# Patient Record
Sex: Male | Born: 1990 | Race: Black or African American | Hispanic: No | Marital: Single | Smoking: Former smoker
Health system: Southern US, Community
[De-identification: ages and names within clinical notes are randomized; demographics above are authoritative.]

## PROBLEM LIST (undated history)

## (undated) ENCOUNTER — Emergency Department: Admission: EM

## (undated) DIAGNOSIS — N4400 Torsion of testis, unspecified: Secondary | ICD-10-CM

## (undated) HISTORY — PX: TESTICLE REMOVAL: SHX68

---

## 2015-09-16 ENCOUNTER — Other Ambulatory Visit
Admission: RE | Admit: 2015-09-16 | Discharge: 2015-09-16 | Disposition: A | Discharge status: Home / Self Care | Attending: Family Medicine | Admitting: Family Medicine

## 2015-09-16 NOTE — Progress Notes (Signed)
Pt brought in custody with [employer] officer Lanae BoastGarner,  RN Broomer collected specimen for forensic blood draw, pt willingly accepted procedure, [employer] officers remain present, Skin prepped with iodine. Pt ambulatory and calm

## 2016-12-11 ENCOUNTER — Encounter: Payer: Self-pay | Admitting: Emergency Medicine

## 2016-12-11 ENCOUNTER — Emergency Department
Admission: EM | Admit: 2016-12-11 | Discharge: 2016-12-11 | Disposition: A | Discharge status: Home / Self Care | Attending: Emergency Medicine | Admitting: Emergency Medicine

## 2016-12-11 DIAGNOSIS — F17200 Nicotine dependence, unspecified, uncomplicated: Secondary | ICD-10-CM | POA: Insufficient documentation

## 2016-12-11 DIAGNOSIS — H00015 Hordeolum externum left lower eyelid: Secondary | ICD-10-CM | POA: Insufficient documentation

## 2016-12-11 MED ORDER — GENTAMICIN SULFATE 0.3 % OP SOLN: 1.0000 [drp] | OPHTHALMIC | 0 refills | Status: DC

## 2016-12-11 NOTE — ED Notes (Signed)
Patient reports symptoms began 2 days ago.  Report woke with pain to left eye and around the eye, with increased tearing.  Denies any blurred vision.  Patient reports feels like something is in his eye.

## 2016-12-11 NOTE — ED Notes (Signed)

## 2016-12-11 NOTE — ED Triage Notes (Signed)
Pt c/o left eye pain starting today.  Only has tearing. NAD. Ambulatory. Denies vision changes.  Denies sx other than pain

## 2016-12-11 NOTE — ED Provider Notes (Signed)
[facility] Emergency Department Provider Note   ____________________________________________   First MD Initiated Contact with Patient 12/11/16 2013     (approximate)  I have reviewed the triage vital signs and the nursing notes.   HISTORY  Chief Complaint Eye Pain    HPI Sean Shaw is a 26 y.o. male patient complaining of left eye pain which started yesterday. Patient state increased tearing. Patient denies vision loss. Patient denies use of corrective lenses.   History reviewed. No pertinent past medical history.  There are no active problems to display for this patient.   History reviewed. No pertinent surgical history.  Prior to Admission medications   Medication Sig Start Date End Date Taking? Authorizing Provider  gentamicin (GARAMYCIN) 0.3 % ophthalmic solution Place 1 drop into the left eye every 4 (four) hours. 12/11/16   Joni ReiningSmith, Anselm Aumiller K, PA-C    Allergies Patient has no known allergies.  History reviewed. No pertinent family history.  Social History Social History  Substance Use Topics  . Smoking status: Current Every Day Smoker  . Smokeless tobacco: Never Used  . Alcohol use No    Review of Systems Constitutional: No fever/chills Eyes: Left eye pain and tearing  ENT: No sore throat. Cardiovascular: Denies chest pain. Respiratory: Denies shortness of breath. Gastrointestinal: No abdominal pain.  No nausea, no vomiting.  No diarrhea.  No constipation. Genitourinary: Negative for dysuria. Musculoskeletal: Negative for back pain. Skin: Negative for rash. Neurological: Negative for headaches, focal weakness or numbness.   ____________________________________________   PHYSICAL EXAM:  VITAL SIGNS: ED Triage Vitals  Enc Vitals Group     BP 12/11/16 1858 (!) 107/57     Pulse Rate 12/11/16 1857 (!) 57     Resp 12/11/16 1857 18     Temp 12/11/16 1857 98.6 F (37 C)     Temp Source 12/11/16 1857 Oral     SpO2  12/11/16 1857 100 %     Weight 12/11/16 1857 120 lb (54.4 kg)     Height 12/11/16 1857 5\' 4"  (1.626 m)     Head Circumference --      Peak Flow --      Pain Score 12/11/16 1857 7     Pain Loc --      Pain Edu? --      Excl. in GC? --     Constitutional: Alert and oriented. Well appearing and in no acute distress. Eyes: Conjunctivae are normal. PERRL. EOMI. Internal papular lesion left lower eyelid Cardiovascular: Normal rate, regular rhythm. Grossly normal heart sounds.  Good peripheral circulation. Respiratory: Normal respiratory effort.  No retractions. Lungs CTAB. Neurologic:  Normal speech and language. No gross focal neurologic deficits are appreciated. No gait instability. Skin:  Skin is warm, dry and intact. No rash noted. Psychiatric: Mood and affect are normal. Speech and behavior are normal.  ____________________________________________   LABS (all labs ordered are listed, but only abnormal results are displayed)  Labs Reviewed - No data to display ____________________________________________  EKG   ____________________________________________  RADIOLOGY  No results found.  ____________________________________________   PROCEDURES  Procedure(s) performed: None  Procedures  Critical Care performed: No  ____________________________________________   INITIAL IMPRESSION / ASSESSMENT AND PLAN / ED COURSE  Pertinent labs & imaging results that were available during my care of the patient were reviewed by me and considered in my medical decision making (see chart for details).  Stye left lower eyelid. He is given discharge care instruction. Patient advised follow-up  with [facility] if no improvement in 3-5 days.      ____________________________________________   FINAL CLINICAL IMPRESSION(S) / ED DIAGNOSES  Final diagnoses:  Hordeolum externum of left lower eyelid      NEW MEDICATIONS STARTED DURING THIS VISIT:  New Prescriptions    GENTAMICIN (GARAMYCIN) 0.3 % OPHTHALMIC SOLUTION    Place 1 drop into the left eye every 4 (four) hours.     Note:  This document was prepared using Dragon voice recognition software and may include unintentional dictation errors.    Joni ReiningSmith, Kynsleigh Westendorf K, PA-C 12/11/16 2023    Emily FilbertWilliams, Jonathan E, MD 12/11/16 [phone]

## 2018-12-05 ENCOUNTER — Other Ambulatory Visit: Payer: Self-pay | Admitting: Internal Medicine

## 2018-12-05 DIAGNOSIS — Z20822 Contact with and (suspected) exposure to covid-19: Secondary | ICD-10-CM

## 2018-12-09 LAB — NOVEL CORONAVIRUS, NAA: SARS-CoV-2, NAA: NOT DETECTED

## 2019-10-15 DIAGNOSIS — N4400 Torsion of testis, unspecified: Secondary | ICD-10-CM

## 2019-10-15 HISTORY — DX: Torsion of testis, unspecified: N44.00

## 2019-12-31 ENCOUNTER — Encounter: Payer: Self-pay | Admitting: Emergency Medicine

## 2019-12-31 ENCOUNTER — Other Ambulatory Visit: Payer: Self-pay

## 2019-12-31 ENCOUNTER — Emergency Department: Payer: Medicaid Other

## 2019-12-31 DIAGNOSIS — F17210 Nicotine dependence, cigarettes, uncomplicated: Secondary | ICD-10-CM | POA: Insufficient documentation

## 2019-12-31 DIAGNOSIS — R52 Pain, unspecified: Secondary | ICD-10-CM | POA: Insufficient documentation

## 2019-12-31 LAB — URINALYSIS, COMPLETE (UACMP) WITH MICROSCOPIC
Bacteria, UA: NONE SEEN
Bilirubin Urine: NEGATIVE
Glucose, UA: NEGATIVE mg/dL
Ketones, ur: NEGATIVE mg/dL
Leukocytes,Ua: NEGATIVE
Nitrite: NEGATIVE
Protein, ur: NEGATIVE mg/dL
Specific Gravity, Urine: 1.021 (ref 1.005–1.030)
pH: 6 (ref 5.0–8.0)

## 2019-12-31 NOTE — ED Triage Notes (Signed)
Pt presents to ED with right sided testicular pain for the past hour. Pt states the last time he felt this way was June 2 when he had a torsion on the left side and had emergency surgery at [facility]. Denies swelling, bruising, or injury at this time.

## 2020-01-01 ENCOUNTER — Emergency Department
Admission: EM | Admit: 2020-01-01 | Discharge: 2020-01-01 | Disposition: A | Discharge status: Home / Self Care | Payer: Medicaid Other | Attending: Emergency Medicine | Admitting: Emergency Medicine

## 2020-01-01 ENCOUNTER — Other Ambulatory Visit: Payer: Self-pay

## 2020-01-01 DIAGNOSIS — N50811 Right testicular pain: Secondary | ICD-10-CM

## 2020-01-01 DIAGNOSIS — R52 Pain, unspecified: Secondary | ICD-10-CM

## 2020-01-01 HISTORY — DX: Torsion of testis, unspecified: N44.00

## 2020-01-01 NOTE — ED Provider Notes (Signed)
[facility] Emergency Department Provider Note ____________________________________________   First MD Initiated Contact with Patient 01/01/20 0802     (approximate)  I have reviewed the triage vital signs and the nursing notes.  HISTORY  Chief Complaint Testicle Pain   HPI Sean Shaw is a 29 y.o. malewho presents to the ED for evaluation of testicular pain.  Chart review indicates hx testicular torsion 2 months ago at [facility] where he had emergent surgical right-sided orchiopexy and left-sided orchiectomy.  Patient reports being in his typical state of health until experiencing sudden onset pain in the evening of 8/17 at about 930 PM.  Due to his recent procedure, patient reports concern for repeat detorsion.  Patient reports pain was severe, 9/10 intensity, to his right testicle.  Radiating to his right groin.  Reports the pain is reminiscent of his previous episode of testicular torsion.  When I see the patient, he has been sitting in the ED waiting room for about 10 hours.  Patient reports improving pain, some "lingering soreness. "Patient denies any vomiting, frontal abdominal pain, back pain, dysuria, hematuria, diarrhea, fevers or syncope.  He reports he does not have insurance, has not followed up with urology since his procedure.    Past Medical History:  Diagnosis Date  . Testicular torsion     There are no problems to display for this patient.   Past Surgical History:  Procedure Laterality Date  . TESTICLE REMOVAL      Prior to Admission medications   Not on File    Allergies Patient has no known allergies.  No family history on file.  Social History Social History   Tobacco Use  . Smoking status: Current Every Day Smoker    Types: Cigarettes  . Smokeless tobacco: Never Used  Vaping Use  . Vaping Use: Never used  Substance Use Topics  . Alcohol use: Yes  . Drug use: Yes    Types: Marijuana    Review of  Systems  Constitutional: No fever/chills Eyes: No visual changes. ENT: No sore throat. Cardiovascular: Denies chest pain. Respiratory: Denies shortness of breath. Gastrointestinal: No abdominal pain.  No nausea, no vomiting.  No diarrhea.  No constipation. Genitourinary: Negative for dysuria.  Positive for testicular pain. Musculoskeletal: Negative for back pain. Skin: Negative for rash. Neurological: Negative for headaches, focal weakness or numbness.   ____________________________________________   PHYSICAL EXAM:  VITAL SIGNS: Vitals:   01/01/20 0803 01/01/20 0815  BP:  114/78  Pulse: 62 62  Resp:  16  Temp:    SpO2:  100%      Constitutional: Alert and oriented. Well appearing and in no acute distress.  Sitting up in bed, well-appearing and conversational full sentences. Eyes: Conjunctivae are normal. PERRL. EOMI. Head: Atraumatic. Nose: No congestion/rhinnorhea. Mouth/Throat: Mucous membranes are moist.  Oropharynx non-erythematous. Neck: No stridor. No cervical spine tenderness to palpation. Cardiovascular: Normal rate, regular rhythm. Grossly normal heart sounds.  Good peripheral circulation. Respiratory: Normal respiratory effort.  No retractions. Lungs CTAB. Gastrointestinal: Soft , nondistended, nontender to palpation. No abdominal bruits. No CVA tenderness. Normal-appearing uncircumcised penis without lesions or tenderness.  Scrotum without swelling, erythema or tenderness.  Left testicle is absent.  Right testicle is smooth, round and without nodularity.  No tenderness to palpation. Musculoskeletal: No lower extremity tenderness nor edema.  No joint effusions. No signs of acute trauma. Neurologic:  Normal speech and language. No gross focal neurologic deficits are appreciated. No gait instability noted. Skin:  Skin  is warm, dry and intact. No rash noted. Psychiatric: Mood and affect are normal. Speech and behavior are  normal.  ____________________________________________   LABS (all labs ordered are listed, but only abnormal results are displayed)  Labs Reviewed  URINALYSIS, COMPLETE (UACMP) WITH MICROSCOPIC - Abnormal; Notable for the following components:      Result Value   Color, Urine YELLOW (*)    APPearance CLEAR (*)    Hgb urine dipstick SMALL (*)    All other components within normal limits   ____________________________________________  12 Lead EKG   ____________________________________________  RADIOLOGY  ED MD interpretation:    Official radiology report(s): US SCROTUM W/DOPPLER  Result Date: 01/01/2020 CLINICAL DATA:  Right testicular pain for 1 hour, history of left testicular torsion 10/2019 EXAM: SCROTAL ULTRASOUND DOPPLER ULTRASOUND OF THE TESTICLES TECHNIQUE: Complete ultrasound examination of the testicles, epididymis, and other scrotal structures was performed. Color and spectral Doppler ultrasound were also utilized to evaluate blood flow to the testicles. COMPARISON:  None. FINDINGS: Right testicle the Measurements: 3.9 x 1.8 x 2.6 cm. Uniform parenchymal echogenicity. No concerning testicular mass or microlithiasis. Linear calcifications noted along the left tunica vaginalis, could reflect sequela of prior infection or inflammation or even postsurgical change. Uniform color flow. Pulsed Doppler interrogation of the right testis demonstrates normal low resistance arterial and venous waveforms. Left testicle Surgically absent Right epididymis:  Normal in size and appearance. Left epididymis:  Surgically absent. Hydrocele:  None visualized. Varicocele:  None visualized. IMPRESSION: No convincing sonographic evidence of right testicular torsion. No concerning right testicular mass or microlithiasis. Few calcifications presumably along the tunica may be post infectious or inflammatory. Left testicle is surgically absent. Electronically Signed   By: Kreg Shropshire M.D.   On: 01/01/2020  00:25    ____________________________________________   PROCEDURES and INTERVENTIONS  Procedure(s) performed (including Critical Care):  Procedures  Medications - No data to display  ____________________________________________   INITIAL IMPRESSION / ASSESSMENT AND PLAN / ED COURSE  29 year old male with history of left testicular torsion and orchiectomy, presenting with improving right testicular pain, without sonographic evidence of torsion and amenable to outpatient management.  Normal vital signs room air.  Reassuring examination without evidence of acute pathology.  He has no abdominal tenderness, inguinal tenderness or masses, and his testicular exam is benign.  Urine without infectious features to suggest UTI, prostatitis or epididymitis.  Ultrasound of his testicles demonstrates a normal-appearing right testicle, surgically absent left testicle.  Hyperechoic calcifications are likely due to postsurgical changes in the setting of his orchiopexy.  Patient is pain-free here in the ED and looks well, I see no evidence of acute pathology to require further emergent urologic consultation.  Provided urology follow-up information within our system as he has not followed up with [facility].  We discussed return precautions for the ED.  Patient medically stable for discharge home.  ____________________________________________   FINAL CLINICAL IMPRESSION(S) / ED DIAGNOSES  Final diagnoses:  Pain  Testicular pain, right     ED Discharge Orders    None       Mung Rinker   Note:  This document was prepared using Dragon voice recognition software and may include unintentional dictation errors.   Delton Prairie, MD 01/01/20 [phone]

## 2020-01-01 NOTE — ED Notes (Signed)
See triage note  Presents with pain to right testicle last pm  States pain was sudden  Hx of torsion and has had left testicle removed  No fever or urinary sxs' at present

## 2020-01-01 NOTE — Discharge Instructions (Signed)
The ultrasound of your testicles and urine test did not show signs of twisting or infection.  I have attached the phone number for one of our urologist, please call their office to discuss the possibility of being seen without insurance.  Return to the ED with any worsening pain.  Please take Tylenol and ibuprofen/Advil for your pain.  It is safe to take them together, or to alternate them every few hours.  Take up to 1000mg  of Tylenol at a time, up to 4 times per day.  Do not take more than 4000 mg of Tylenol in 24 hours.  For ibuprofen, take 400-600 mg, 4-5 times per day.

## 2021-10-16 IMAGING — US US SCROTUM W/ DOPPLER COMPLETE
1 series · 13 of 25 positions shown · non-contrast
Comparison: None.

CLINICAL DATA: Right testicular pain for 1 hour, history of left
testicular torsion [DATE]

EXAM:
SCROTAL ULTRASOUND
DOPPLER ULTRASOUND OF THE TESTICLES
TECHNIQUE: Complete ultrasound examination of the testicles, epididymis, and
other scrotal structures was performed. Color and spectral Doppler
ultrasound were also utilized to evaluate blood flow to the
testicles.

[Series 1: us scrotum w/doppler · 13 of 36 slices shown]
[im 1/36]
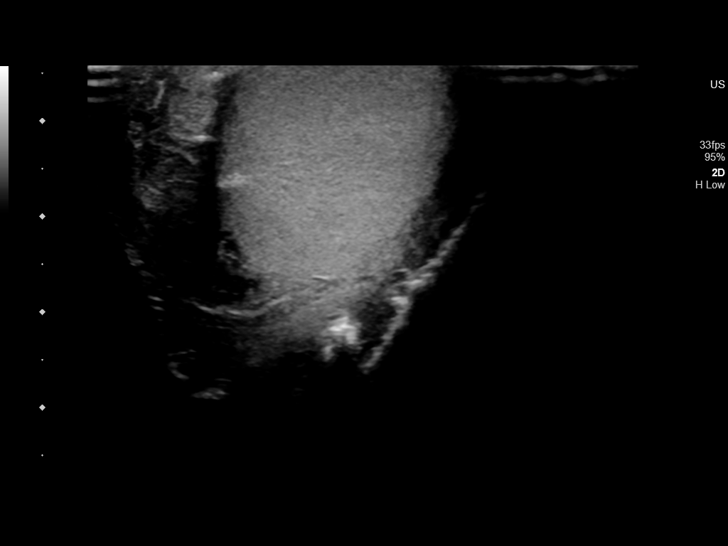
[im 3/36]
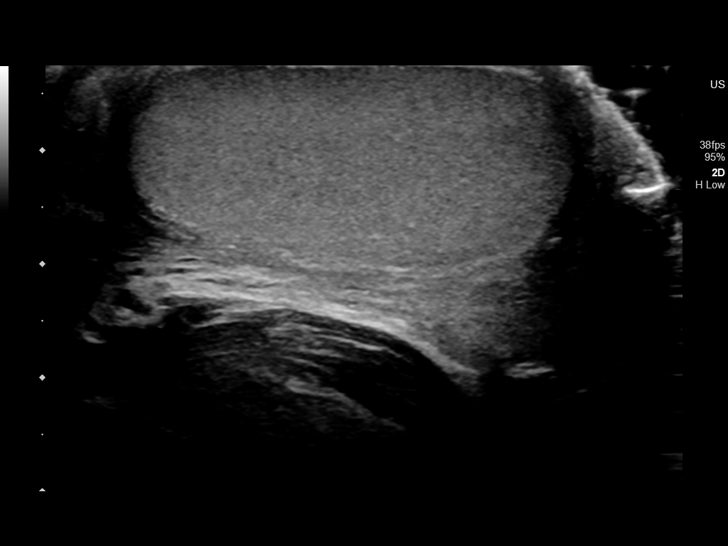
[im 6/36]
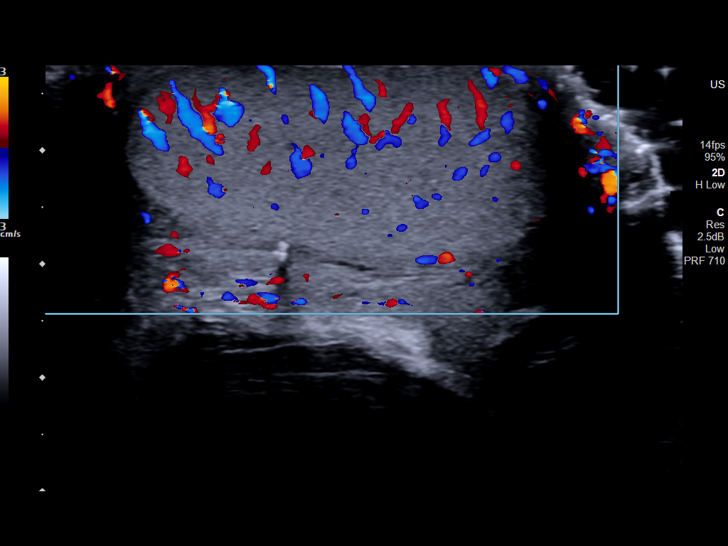
[im 9/36]
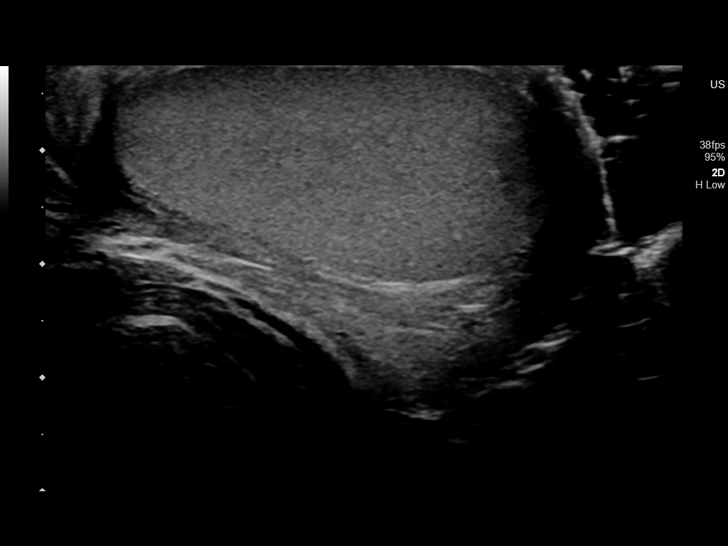
[im 12/36]
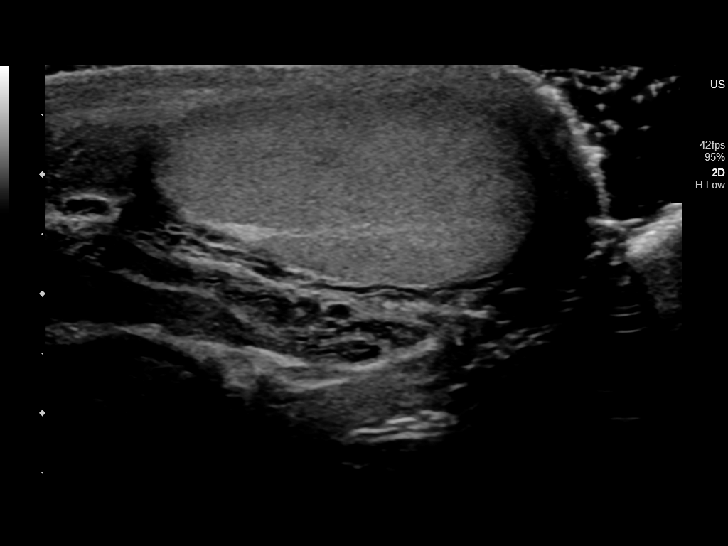
[im 15/36]
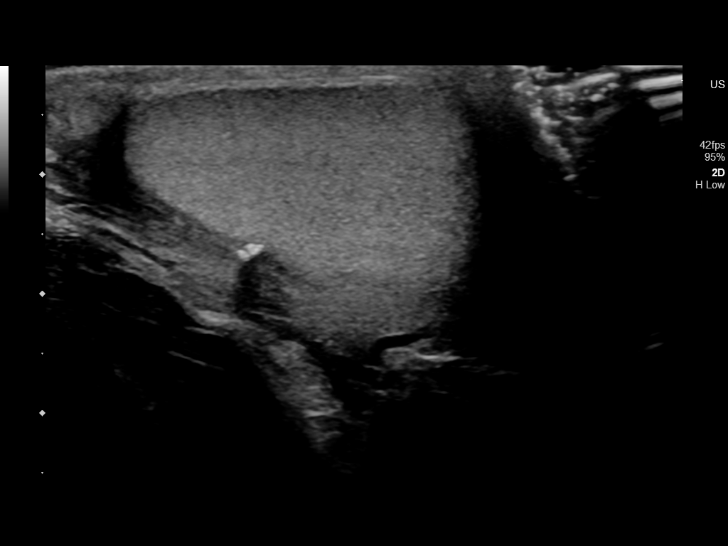
[im 18/36]
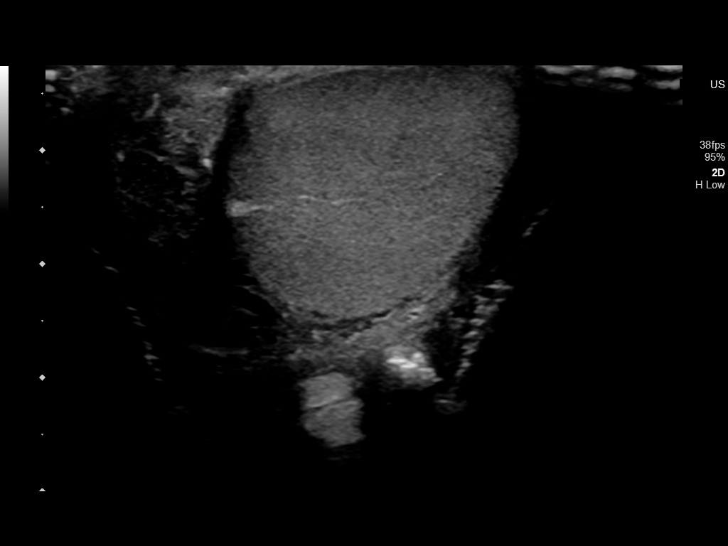
[im 21/36]
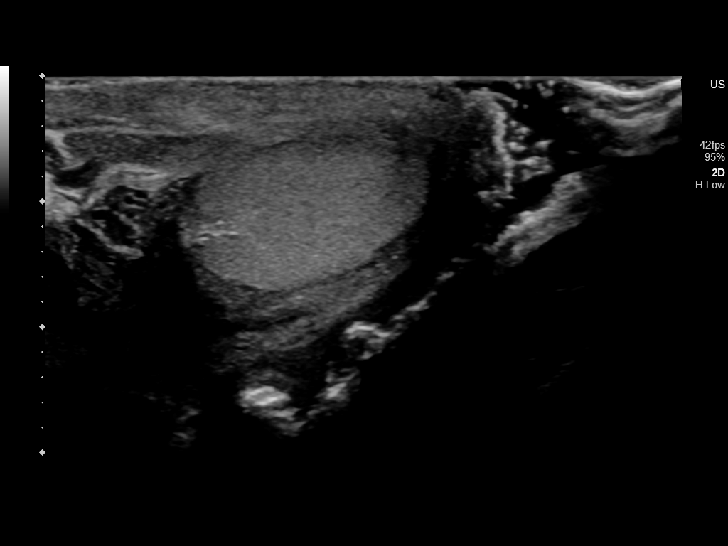
[im 24/36]
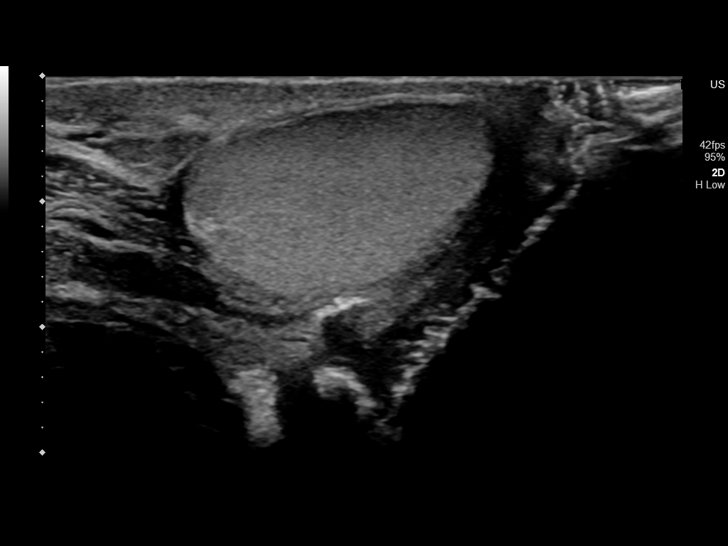
[im 27/36]
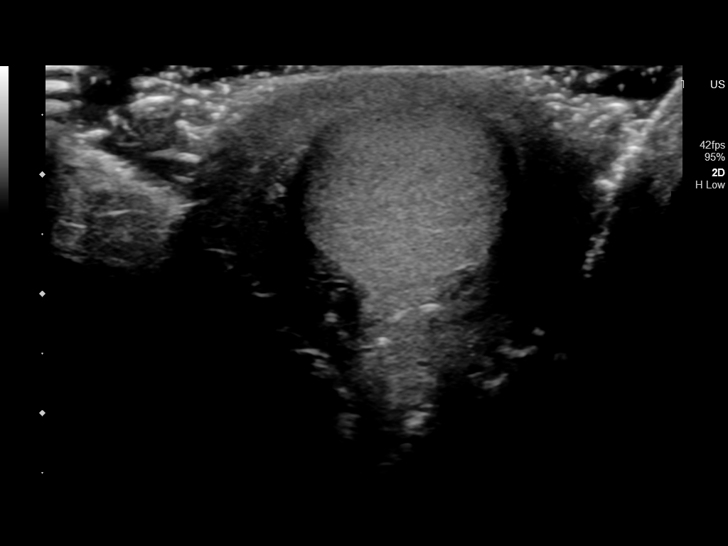
[im 30/36]
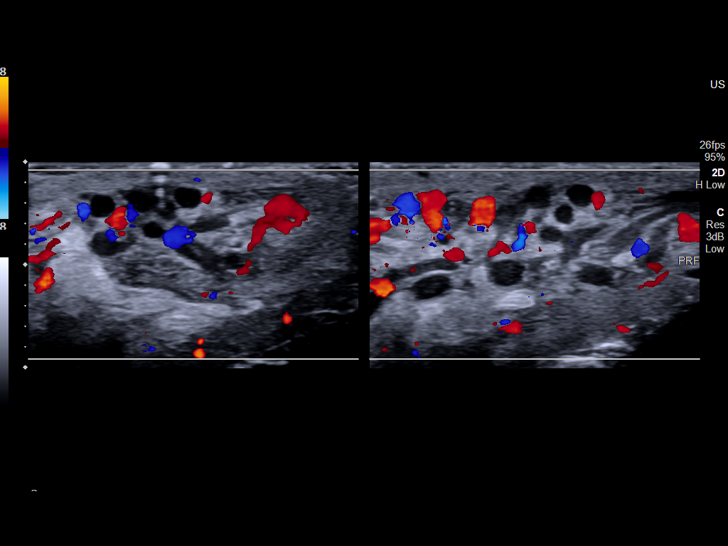
[im 33/36]
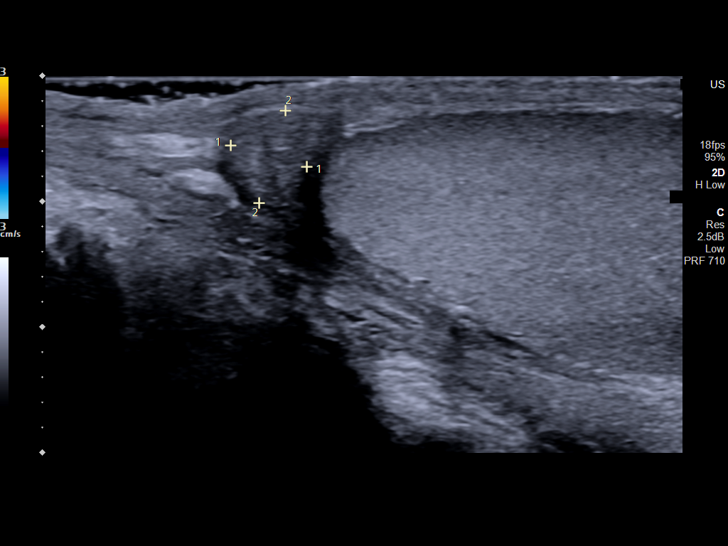
[im 36/36]
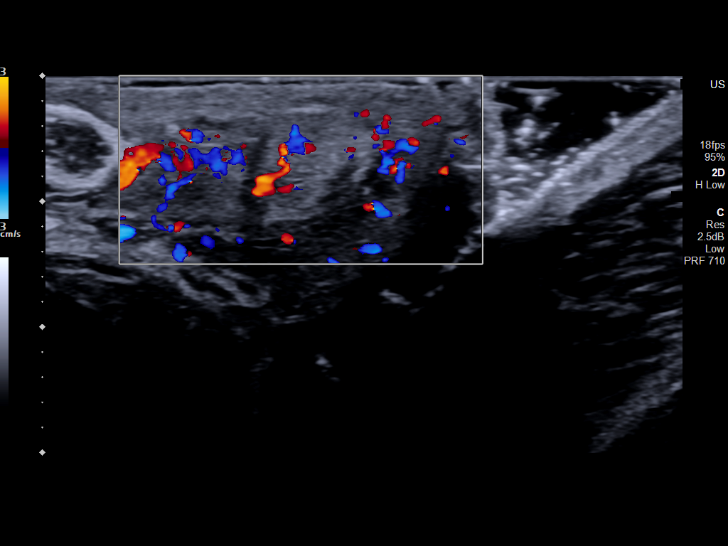

[13 of 25 positions shown; findings below may reference images not displayed]

FINDINGS: Right testicle the

Measurements: 3.9 x 1.8 x 2.6 cm. Uniform parenchymal echogenicity.
No concerning testicular mass or microlithiasis. Linear
calcifications noted along the left tunica vaginalis, could reflect
sequela of prior infection or inflammation or even postsurgical
change. Uniform color flow. Pulsed Doppler interrogation of the
right testis demonstrates normal low resistance arterial and venous
waveforms.

Left testicle

Surgically absent

Right epididymis:  Normal in size and appearance.

Left epididymis:  Surgically absent.

Hydrocele:  None visualized.

Varicocele:  None visualized.
IMPRESSION: No convincing sonographic evidence of right testicular torsion.

No concerning right testicular mass or microlithiasis. Few
calcifications presumably along the tunica may be post infectious or
inflammatory.

Left testicle is surgically absent.

## 2022-03-02 NOTE — Congregational Nurse Program (Signed)
  Dept: [phone]   Congregational Nurse Program Note  Date of Encounter: 03/02/2022 Client to [facility] with request for assistance with a dental appointment. Dental referral form completed and emailed to [facility]. The office is closed today, RN to contact the dental office tomorrow 10/19 to make an appointment. No other needs at this time. Past Medical History: Past Medical History:  Diagnosis Date   Testicular torsion     Encounter Details:  CNP Questionnaire - 03/02/22 1305       Questionnaire   Ask client: Do you give verbal consent for me to treat you today? Yes    Student Assistance N/A    Location Patient [place]    Visit Setting with Client Organization    Patient Status Unhoused   client lives in a tent   Insurance Uninsured ([place] Card/Care Connects/Self-Pay/Medicaid Family Planning)    Insurance/Financial Assistance Referral N/A    Medication N/A    Medical Provider No    Screening Referrals Made N/A    Medical Referrals Made Dental    Medical Appointment Made N/A    Recently w/o PCP, now 1st time PCP visit completed due to CNs referral or appointment made N/A    Food Have Food Insecurities   does not have food stamps   Transportation N/A   has a car   Housing/Utilities No permanent housing    Interpersonal Safety N/A    Interventions Advocate/Support;Case Management    Abnormal to Normal Screening Since Last CN Visit N/A    Screenings CN Performed N/A    Sent Client to Lab for: N/A    Did client attend any of the following based off CNs referral or appointments made? N/A    ED Visit Averted N/A    Life-Saving Intervention Made N/A

## 2022-03-03 NOTE — Congregational Nurse Program (Signed)
  Dept: [phone]   Congregational Nurse Program Note  Date of Encounter: 03/03/2022 RN called [name] to make an appointment following referral made yesterday. RN was unable to make as appointment as [facility] does not see clients for cleaning only. They are seeing [name] clients for extractions only. Client notified. Past Medical History: Past Medical History:  Diagnosis Date   Testicular torsion     Encounter Details:  CNP Questionnaire - 03/03/22 1000       Questionnaire   Ask client: Do you give verbal consent for me to treat you today? Yes    Student Assistance N/A    Location Patient [place]    Visit Setting with Client Organization    Patient Status Unhoused   client lives in a tent   Insurance Uninsured ([place] Card/Care Connects/Self-Pay/Medicaid Family Planning)    Insurance/Financial Assistance Referral N/A    Medication N/A    Medical Provider No    Screening Referrals Made N/A    Medical Referrals Made N/A   dental referral made to [facility] dentistry, they are unable to see clients just for a cleaning, RN was unable to make and appointment. Client notified   Medical Appointment Made N/A   unable to make an appointment as [facility] does only extractions, not regular cleanings, clientmade aware   Recently w/o PCP, now 1st time PCP visit completed due to CNs referral or appointment made [place] Insecurities   does not have food stamps   Transportation N/A   has a car   Housing/Utilities No permanent housing    Interpersonal Safety N/A    Interventions Case Management;Educate    Abnormal to Normal Screening Since Last CN Visit N/A    Screenings CN Performed N/A    Sent Client to Lab for: N/A    Did client attend any of the following based off CNs referral or appointments made? N/A    ED Visit Averted N/A    Life-Saving Intervention Made N/A

## 2022-05-12 ENCOUNTER — Emergency Department: Payer: Medicaid Other

## 2022-05-12 ENCOUNTER — Other Ambulatory Visit: Payer: Self-pay

## 2022-05-12 ENCOUNTER — Emergency Department
Admission: EM | Admit: 2022-05-12 | Discharge: 2022-05-12 | Disposition: A | Discharge status: Home / Self Care | Payer: Medicaid Other | Attending: Emergency Medicine | Admitting: Emergency Medicine

## 2022-05-12 DIAGNOSIS — Z1152 Encounter for screening for COVID-19: Secondary | ICD-10-CM | POA: Insufficient documentation

## 2022-05-12 DIAGNOSIS — J069 Acute upper respiratory infection, unspecified: Secondary | ICD-10-CM | POA: Insufficient documentation

## 2022-05-12 DIAGNOSIS — R059 Cough, unspecified: Secondary | ICD-10-CM | POA: Diagnosis present

## 2022-05-12 LAB — RESP PANEL BY RT-PCR (RSV, FLU A&B, COVID)  RVPGX2
Influenza A by PCR: NEGATIVE
Influenza B by PCR: NEGATIVE
Resp Syncytial Virus by PCR: NEGATIVE
SARS Coronavirus 2 by RT PCR: NEGATIVE

## 2022-05-12 NOTE — ED Provider Notes (Signed)
   [facility] Provider Note    Event Date/Time   First MD Initiated Contact with Patient 05/12/22 [phone]     (approximate)   History   Cough, Nasal Congestion, and Headache   HPI  Sean Shaw is a 31 y.o. male who presents with upper respiratory infectious symptoms.  He has had nasal congestion fatigue and a cough for about 3 days     Physical Exam   Triage Vital Signs: ED Triage Vitals  Enc Vitals Group     BP 05/12/22 0848 115/78     Pulse Rate 05/12/22 0848 63     Resp 05/12/22 0848 20     Temp 05/12/22 0848 98 F (36.7 C)     Temp Source 05/12/22 0848 Oral     SpO2 05/12/22 0848 98 %     Weight 05/12/22 0847 56 kg (123 lb 7.3 oz)     Height 05/12/22 0847 1.6 m (5\' 3" )     Head Circumference --      Peak Flow --      Pain Score 05/12/22 0847 3     Pain Loc --      Pain Edu? --      Excl. in GC? --     Most recent vital signs: Vitals:   05/12/22 0848  BP: 115/78  Pulse: 63  Resp: 20  Temp: 98 F (36.7 C)  SpO2: 98%     General: Awake, no distress.  CV:  Good peripheral perfusion.  Resp:  Normal effort.  Clear to auscultation bilaterally Abd:  No distention.  Other:     ED Results / Procedures / Treatments   Labs (all labs ordered are listed, but only abnormal results are displayed) Labs Reviewed  RESP PANEL BY RT-PCR (RSV, FLU A&B, COVID)  [id number]     EKG     RADIOLOGY Chest x-ray viewed interpreted by me, no pneumonia    PROCEDURES:  Critical Care performed:   Procedures   MEDICATIONS ORDERED IN ED: Medications - No data to display   IMPRESSION / MDM / ASSESSMENT AND PLAN / ED COURSE  I reviewed the triage vital signs and the nursing notes. Patient's presentation is most consistent with acute complicated illness / injury requiring diagnostic workup.   Patient presents with upper respiratory symptoms as above.  Suspect viral illness given constellation of symptoms.  Respiratory panel is negative,  chest x-ray negative for pneumonia, recommend supportive care, outpatient follow-up, no indication for admission       FINAL CLINICAL IMPRESSION(S) / ED DIAGNOSES   Final diagnoses:  Viral URI with cough     Rx / DC Orders   ED Discharge Orders     None        Note:  This document was prepared using Dragon voice recognition software and may include unintentional dictation errors.   05/14/22, MD 05/12/22 1600

## 2022-05-12 NOTE — ED Notes (Signed)
Patient discharged to home per MD order. Patient in stable condition, and deemed medically cleared by ED provider for discharge. Discharge instructions reviewed with patient/family using "Teach Back"; verbalized understanding of medication education and administration, and information about follow-up care. Denies further concerns. ° °

## 2022-05-12 NOTE — ED Triage Notes (Signed)
Pt reports cough, congestion, nasal drainage and feeling tired for the last 3 days. Pt reports chest also hurts when he coughs.

## 2022-05-31 NOTE — Congregational Nurse Program (Signed)
  Dept: [phone]   Congregational Nurse Program Note  Date of Encounter: 05/31/2022 Client to [facility] day center today with his Medicaid paperwork. Verified he has [employer]. RN assisted client with making an appointment for an eye exam. Able to make an appointment today at [facility] in [place]. Client is aware that he will have a 4.00 copay for his exam and a  4.00 copay for his glasses. He will be able to pay the 8.00 when he picks up his glasses.  Bus passes given to client. RN to assist client with finding a PCP and dentist at next visit. Client appreciative of help provided. Past Medical History: Past Medical History:  Diagnosis Date   Testicular torsion     Encounter Details:  CNP Questionnaire - 05/31/22 1030       Questionnaire   Ask client: Do you give verbal consent for me to treat you today? Yes    Student Assistance N/A    Location Patient [place]    Visit Setting with Client Organization    Patient Status Unhoused   client lives in a tent   Insurance Medicaid   [employer] effective 04/15/2022, and verified for 2024   Insurance/Financial Assistance Referral N/A    Medication N/A    Medical Provider No   RN to assist client with finding a PCP that takes his medicaid   Screening Referrals Made N/A    Medical Referrals Made Vision   [facility]   Medical Appointment Made Vision   aot made for today 1/16 at 2 pm   Recently w/o PCP, now 1st time PCP visit completed due to CNs referral or appointment made [place] Insecurities   does not have food stamps   Transportation Provided transportation assistance   [employer] bus passess given for eye apt today   Housing/Utilities No permanent housing    Chiropractor N/A    Interventions Case [place];Advocate/Support    Abnormal to Normal Screening Since Last CN Visit N/A    Screenings CN Performed N/A    Sent Client to Lab for: N/A    Did  client attend any of the following based off CNs referral or appointments made? Yes   vision   ED Visit Averted N/A    Life-Saving Intervention Made N/A

## 2022-07-07 NOTE — Congregational Nurse Program (Signed)
  Dept: [place] Nurse Program Note  Date of Encounter: 07/07/2022 Client now has Medicaid, [employer] and requests assistance in finding a dentist that serves Medicaid patients. RN was able to make a dental appointment at [facility] for March 6th at 9:30. Appointment card given to client.  No other needs at this time. Past Medical History: Past Medical History:  Diagnosis Date   Testicular torsion     Encounter Details:  CNP Questionnaire - 07/06/22 1009       Questionnaire   Ask client: Do you give verbal consent for me to treat you today? Yes    Student Assistance N/A    Location Patient [place]    Visit Setting with Client Organization    Patient Status Unhoused   client lives in a tent   Insurance Medicaid   [employer] effective 04/15/2022, and verified for 2024   Insurance/Financial Assistance Referral N/A    Medication N/A    Medical Provider No   RN to assist client with finding a PCP that takes his medicaid   Screening Referrals Made N/A    Medical Referrals Made Dental    Medical Appointment Made Dental   Apt made at [place] for 3/6 at 9:30 am   Recently w/o PCP, now 1st time PCP visit completed due to CNs referral or appointment made [place] [facility]   does not have food stamps   Transportation Provided transportation assistance   Link bus passess given for eye apt today   Housing/Utilities No permanent housing    Chiropractor N/A    Interventions [place];Advocate/Support    Abnormal to Normal Screening Since Last CN Visit N/A    Screenings CN Performed N/A    Sent Client to Lab for: N/A    Did client attend any of the following based off CNs referral or appointments made? N/A   vision   ED Visit Averted N/A    Life-Saving Intervention Made N/A

## 2022-07-14 NOTE — Congregational Nurse Program (Signed)
  Dept: [phone]   Congregational Nurse Program Note  Date of Encounter: 07/14/2022 Client to [facility] day center in need of first aide to a small cut on the lateral aspect of his left fore finger. Antibiotic ointment applied, no redness, drainage or swelling noted, no discoloration around the cut or to the finger. Nonadherent small bandage applied. No other needs at this time. Past Medical History: Past Medical History:  Diagnosis Date   Testicular torsion     Encounter Details:  CNP Questionnaire - 07/14/22 1015       Questionnaire   Ask client: Do you give verbal consent for me to treat you today? Yes    Student Assistance N/A    Location Patient [place]    Visit Setting with Client Organization    Patient Status Unhoused   client lives in a tent   Insurance Medicaid   [employer] effective 04/15/2022, and verified for 2024   Insurance/Financial Assistance Referral N/A    Medication N/A    Medical Provider No   RN to assist client with finding a PCP that takes his medicaid   Screening Referrals Made N/A    Medical Referrals Made Dental    Medical Appointment Made Dental   Apt made at [place] for 3/6 at 9:30 am   Recently w/o PCP, now 1st time PCP visit completed due to CNs referral or appointment made [place] [facility]   does not have food stamps   Transportation Provided transportation assistance   Link bus passess given for eye apt today   Housing/Utilities No permanent housing    Chiropractor N/A    Interventions [place];Advocate/Support    Abnormal to Normal Screening Since Last CN Visit N/A    Screenings CN Performed N/A    Sent Client to Lab for: N/A    Did client attend any of the following based off CNs referral or appointments made? N/A   vision   ED Visit Averted N/A    Life-Saving Intervention Made N/A

## 2022-10-14 ENCOUNTER — Ambulatory Visit: Payer: Medicaid Other | Admitting: Family Medicine

## 2022-10-14 ENCOUNTER — Encounter: Payer: Self-pay | Admitting: Family Medicine

## 2022-10-14 DIAGNOSIS — Z113 Encounter for screening for infections with a predominantly sexual mode of transmission: Secondary | ICD-10-CM | POA: Diagnosis not present

## 2022-10-14 LAB — HM HEPATITIS C SCREENING LAB: HM Hepatitis Screen: NEGATIVE

## 2022-10-14 LAB — HM HIV SCREENING LAB: HM HIV Screening: NEGATIVE

## 2022-10-14 LAB — HEPATITIS B SURFACE ANTIGEN

## 2022-10-14 NOTE — Progress Notes (Signed)
Pt is here for STD screening.  Condoms given.  Giovonni Poirier M Khiry Pasquariello, RN  

## 2022-10-14 NOTE — Progress Notes (Signed)
[facility] Department STI clinic/screening visit  Subjective:  Sean Shaw is a 32 y.o. male being seen today for an STI screening visit. The patient reports they do not have symptoms.    Patient has the following medical conditions:  There are no problems to display for this patient.    Chief Complaint  Patient presents with   SEXUALLY TRANSMITTED DISEASE    No symp, routine check    HPI  Patient reports to clinic for STI testing.   Last HIV test per patient/review of record was No results found for: "HMHIVSCREEN" No results found for: "HIV"  Does the patient or their partner desires a pregnancy in the next year? No  Screening for MPX risk: Does the patient have an unexplained rash? No Is the patient MSM? No Does the patient endorse multiple sex partners or anonymous sex partners? No Did the patient have close or sexual contact with a person diagnosed with MPX? No Has the patient traveled outside the [place] where MPX is endemic? No Is there a high clinical suspicion for MPX-- evidenced by one of the following No  -Unlikely to be chickenpox  -Lymphadenopathy  -Rash that present in same phase of evolution on any given body part   See flowsheet for further details and programmatic requirements.   Immunization History  Administered Date(s) Administered   Hepatitis B, PED/ADOLESCENT 02/27/1995   MMR 02/27/1995     The following portions of the patient's history were reviewed and updated as appropriate: allergies, current medications, past medical history, past social history, past surgical history and problem list.  Objective:  There were no vitals filed for this visit.  Physical Exam Vitals and nursing note reviewed.  Constitutional:      Appearance: Normal appearance.  HENT:     Head: Normocephalic and atraumatic.     Mouth/Throat:     Mouth: Mucous membranes are moist.     Pharynx: No oropharyngeal exudate or posterior oropharyngeal erythema.  Eyes:      General:        Right eye: No discharge.        Left eye: No discharge.     Conjunctiva/sclera:     Right eye: Right conjunctiva is not injected. No exudate.    Left eye: Left conjunctiva is not injected. No exudate. Pulmonary:     Effort: Pulmonary effort is normal.  Abdominal:     General: Abdomen is flat.     Palpations: Abdomen is soft. There is no hepatomegaly or mass.     Tenderness: There is no abdominal tenderness. There is no rebound.  Genitourinary:    Comments: Declined genital exam- asymptomatic Lymphadenopathy:     Cervical: No cervical adenopathy.     Upper Body:     Right upper body: No supraclavicular or axillary adenopathy.     Left upper body: No supraclavicular or axillary adenopathy.  Skin:    General: Skin is warm and dry.  Neurological:     Mental Status: He is alert and oriented to person, place, and time.       Assessment and Plan:  Sean Shaw is a 32 y.o. male presenting to the [facility] Department for STI screening  1. Screening for venereal disease  - Chlamydia/GC NAA, Confirmation - Syphilis Serology, [place] [facility] - HIV/HCV [place] [facility] - HBV Antigen/Antibody [facility] Lab - Gonococcus culture     Patient does not have STI symptoms Patient accepted all screenings including  urine GC/Chlamydia, and  blood work for HIV/Syphilis. Patient meets criteria for HepB screening? Yes. Ordered? yes Patient meets criteria for HepC screening? Yes. Ordered? yes Recommended condom use with all sex Discussed importance of condom use for STI prevent  Treat positive test results per standing order. Discussed time line for State Lab results and that patient will be called with positive results and encouraged patient to call if he had not heard in 2 weeks Recommended repeat testing in 3 months with positive results. Recommended returning for continued or worsening symptoms.   No follow-ups on file.  No future appointments. Total time spent 20  minutes  Lenice Llamas, [place]

## 2022-10-18 NOTE — Congregational Nurse Program (Signed)
  Dept: [phone]   Congregational Nurse Program Note  Date of Encounter: 10/18/2022 Client to [facility] to discuss his dental apt. Apt was in March. He reports it was recommended that he have all 4 of his wisdom teeth extracted and that he was referred to a "place in [place]". RN assisted client with identifying the correct office. Client reports he never received an insurance card. RN assisted client with getting the member services number for [employer] health. Client declined assistance with calling. Client was unsure if he had an appointment with the dental practice in [place]. He did report he could get transportation if he needed it. Client to return to clinic for assistance again tomorrow if needed. Sean Shaw BSN, RN Past Medical History: Past Medical History:  Diagnosis Date   Testicular torsion     Encounter Details:  CNP Questionnaire - 10/18/22 1030       Questionnaire   Ask client: Do you give verbal consent for me to treat you today? Yes    Student Assistance N/A    Location Patient Served  [facility]    Visit Setting with Client Organization    Patient Status Unhoused   client lives in a tent   Insurance Medicaid   [employer] effective 04/15/2022, and verified for 2024   Insurance/Financial Assistance Referral N/A    Medication N/A    Medical Provider No   RN to assist client with finding a PCP that takes his medicaid   Screening Referrals Made N/A    Medical Referrals Made N/A    Medical Appointment Made N/A    Recently w/o PCP, now 1st time PCP visit completed due to CNs referral or appointment made N/A    Food Have Food Insecurities   does not have food stamps   Transportation N/A    Housing/Utilities No permanent housing    Economist N/A    Interventions [place] [employer] [facility];Advocate/Support;Case Management   assisted client with getting a new insurance card   Abnormal to Normal Screening Since Last CN Visit N/A     Screenings CN Performed N/A    Sent Client to Lab for: N/A    Did client attend any of the following based off CNs referral or appointments made? N/A   vision   ED Visit Averted N/A    Life-Saving Intervention Made N/A

## 2022-10-19 LAB — CHLAMYDIA/GC NAA, CONFIRMATION
Chlamydia trachomatis, NAA: NEGATIVE
Neisseria gonorrhoeae, NAA: NEGATIVE

## 2022-10-19 LAB — GONOCOCCUS CULTURE

## 2022-10-20 NOTE — Congregational Nurse Program (Signed)
  Dept: [phone]   Congregational Nurse Program Note  Date of Encounter: 10/20/2022 Client to [facility] for assistance with obtaining a new insurance card. Upon calling [employer] member services, RN was told client's [employer] will end on 6/30. RN discussed with client. He is unaware/uncertain if he has received a letter for [facility] relating this information. He plans to go to Social Services to obtain a recertification application. He will also contact the person who gets his mail to determine if he has mail from [employer]. Client may also make an account on the [employer] website.  RN was able to set client an new patient apt at [facility] for 7/24 at 3 pm. Rn to continue to assist with Medicaid application if needed. Sean Shaw BSN, RN Past Medical History: Past Medical History:  Diagnosis Date   Testicular torsion     Encounter Details:  CNP Questionnaire - 10/20/22 1359       Questionnaire   Ask client: Do you give verbal consent for me to treat you today? Yes    Student Assistance N/A    Location Patient Served  [facility]    Visit Setting with Client Organization    Patient Status Unhoused   client lives in a tent   Insurance Medicaid   [employer] effective 04/15/2022, and verified for 2024   Insurance/Financial Assistance Referral N/A    Medication N/A    Medical Provider No   new patient apt made today   Screening Referrals Made N/A    Medical Referrals Made [facility]   [facility]   Medical Appointment Made [facility] PCP[facility]clinic   [place] [facility] Dr. Huntley Dec Pard[facility] apt 7/24 at 3pm   Recently w/o PCP, now 1st time PCP visit completed due to CNs referral or appointment made N/A    Food Have Food Insecurities   does not have food stamps   Transportation N/A    Housing/Utilities No permanent housing    Interpersonal Safety N/A    Interventions [place] [facility];Advocate/Support;Case  Management   assisted client with getting a new insurance card with current PCP,   Abnormal to Normal Screening Since Last CN Visit N/A    Screenings CN Performed N/A    Sent Client to Lab for: N/A    Did client attend any of the following based off CNs referral or appointments made? N/A   vision   ED Visit Averted N/A    Life-Saving Intervention Made N/A

## 2022-11-09 NOTE — Congregational Nurse Program (Signed)
  Dept: [phone]   Congregational Nurse Program Note  Date of Encounter: 11/09/2022 Client to [facility] day center to report he had his wisdom teeth extracted a week ago. Pain minimal and he has had no fever or drainage from the extraction sites. He did have all 4 extracted. He reports he has been rinsing after eating and not drinking hot liquids, also not using straws. Client reports he has no follow apt at the oral surgeon at this time. He does need to make a dental apt at [facility], he plans to do that on his own. Francesco Runner BSN, RN Past Medical History: Past Medical History:  Diagnosis Date   Testicular torsion     Encounter Details:  CNP Questionnaire - 11/09/22 0855       Questionnaire   Ask client: Do you give verbal consent for me to treat you today? Yes    Student Assistance N/A    Location Patient Served  [facility]    Visit Setting with Client Organization    Patient Status Unhoused   client lives in a tent   Insurance Medicaid   [employer] effective 04/15/2022, and verified for 2024   Insurance/Financial Assistance Referral N/A    Medication N/A    Medical Provider No   new patient apt made today   Screening Referrals Made N/A    Recently w/o PCP, now 1st time PCP visit completed due to CNs referral or appointment made N/A    Food Have Food Insecurities   does not have food stamps   Transportation N/A    Housing/Utilities No permanent housing    Interpersonal Safety N/A    Interventions Advocate/Support    Abnormal to Normal Screening Since Last CN Visit N/A    Screenings CN Performed N/A    Sent Client to Lab for: N/A    Did client attend any of the following based off CNs referral or appointments made? N/A   vision   ED Visit Averted N/A    Life-Saving Intervention Made N/A

## 2022-12-02 NOTE — Addendum Note (Signed)
Addended by: Heywood Bene on: 12/02/2022 01:09 PM   Modules accepted: Orders

## 2022-12-07 ENCOUNTER — Ambulatory Visit: Payer: Medicaid Other | Admitting: Family Medicine

## 2022-12-07 ENCOUNTER — Encounter: Payer: Self-pay | Admitting: Family Medicine

## 2022-12-07 VITALS — BP 117/72 | HR 69 | Temp 98.0°F | Ht 63.0 in | Wt 126.0 lb

## 2022-12-07 DIAGNOSIS — Z0000 Encounter for general adult medical examination without abnormal findings: Secondary | ICD-10-CM

## 2022-12-07 DIAGNOSIS — R599 Enlarged lymph nodes, unspecified: Secondary | ICD-10-CM | POA: Diagnosis not present

## 2022-12-07 DIAGNOSIS — H6123 Impacted cerumen, bilateral: Secondary | ICD-10-CM | POA: Diagnosis not present

## 2022-12-07 DIAGNOSIS — H5789 Other specified disorders of eye and adnexa: Secondary | ICD-10-CM

## 2022-12-07 DIAGNOSIS — J302 Other seasonal allergic rhinitis: Secondary | ICD-10-CM

## 2022-12-07 DIAGNOSIS — L910 Hypertrophic scar: Secondary | ICD-10-CM

## 2022-12-07 DIAGNOSIS — H18413 Arcus senilis, bilateral: Secondary | ICD-10-CM

## 2022-12-07 MED ORDER — DEBROX 6.5 % OT SOLN: 5.0000 [drp] | Freq: Two times a day (BID) | OTIC | 0 refills | Status: AC

## 2022-12-07 MED ORDER — CETIRIZINE HCL 10 MG PO TABS: 10.0000 mg | ORAL_TABLET | Freq: Every day | ORAL | 11 refills | Status: AC

## 2022-12-07 MED ORDER — TRIAMCINOLONE ACETONIDE 0.1 % EX CREA: 1.0000 | TOPICAL_CREAM | Freq: Two times a day (BID) | CUTANEOUS | 0 refills | Status: AC

## 2022-12-07 NOTE — Progress Notes (Unsigned)
New patient visit   Patient: Sean Shaw   DOB: 1990-06-11   31 y.o. Male  MRN: [id number] Visit Date: 12/07/2022  Today's healthcare provider: Sherlyn Hay, DO   Chief Complaint  Patient presents with   Establish Care   Subjective    Sean Shaw is a 32 y.o. male who presents today as a new patient to establish care.  HPI HPI   Patient has not complains, questions or concerns.   Last edited by Adline Peals, CMA on 12/07/2022  3:29 PM.      Last went to a doctor at around 32 yo  Usually has a BM every other day Patient states that he used to donate plasma, though he has not done this in several years to try to allow the hyperkeratotic lesion in their area of access to heal.  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   left   Past Surgical History:  Procedure Laterality Date   TESTICLE REMOVAL     Family Status  Relation Name Status   MGM  (Not Specified)  No partnership data on file   Family History  Problem Relation Age of Onset   Breast cancer Maternal Grandmother    Social History   Socioeconomic History   Marital status: Single    Spouse name: Not on file   Number of children: Not on file   Years of education: Not on file   Highest education level: Not on file  Occupational History   Not on file  Tobacco Use   Smoking status: Former    Current packs/day: 0.00    Types: Cigarettes    Quit date: 03/30/2015    Years since quitting: 7.7    Passive exposure: Never   Smokeless tobacco: Never  Vaping Use   Vaping status: Never Used  Substance and Sexual Activity   Alcohol use: Yes    Alcohol/week: 1.0 standard drink of alcohol    Types: 1 Cans of beer per week   Drug use: Not Currently    Types: Marijuana    Comment: stopped in December 2023   Sexual activity: Yes    Birth control/protection: Condom  Other Topics  Concern   Not on file  Social History Narrative   Not on file   Social Determinants of Health   Financial Resource Strain: Not on file  Food Insecurity: No Food Insecurity (12/07/2022)   Hunger Vital Sign    Worried About Running Out of Food in the Last Year: Never true    Ran Out of Food in the Last Year: Never true  Transportation Needs: No Transportation Needs (12/07/2022)   PRAPARE - Administrator, Civil Service (Medical): No    Lack of Transportation (Non-Medical): No  Physical Activity: Not on file  Stress: Not on file  Social Connections: Not on file   No outpatient medications prior to visit.   No facility-administered medications prior to visit.   No Known Allergies  Immunization History  Administered Date(s) Administered   Hepatitis B, PED/ADOLESCENT 02/27/1995   MMR 02/27/1995    Health Maintenance  Topic Date Due   DTaP/Tdap/Td (1 - Tdap) Never done   COVID-19 Vaccine (1 - 2023-24 season) Never done   INFLUENZA VACCINE  12/15/2022   Hepatitis C Screening  Completed   HIV Screening  Completed  HPV VACCINES  Aged Out    Patient Care Team: Siddharth Babington, Monico Blitz, DO as PCP - General (Family Medicine)  Review of Systems  Constitutional:  Negative for appetite change, chills, fatigue and fever.  HENT:  Negative for congestion, ear pain, hearing loss, nosebleeds and trouble swallowing.   Eyes:  Negative for pain and visual disturbance.  Respiratory:  Negative for cough, chest tightness and shortness of breath.   Cardiovascular:  Negative for chest pain, palpitations and leg swelling.  Gastrointestinal:  Negative for abdominal pain, blood in stool, constipation, diarrhea, nausea and vomiting.  Endocrine: Negative for polydipsia, polyphagia and polyuria.  Genitourinary:  Negative for dysuria and flank pain.  Musculoskeletal:  Negative for arthralgias, back pain, joint swelling, myalgias and neck stiffness.  Skin:  Negative for color change, rash and  wound.  Neurological:  Negative for dizziness, tremors, seizures, speech difficulty, weakness, light-headedness and headaches.  Psychiatric/Behavioral:  Negative for behavioral problems, confusion, decreased concentration, dysphoric mood and sleep disturbance. The patient is not nervous/anxious.   All other systems reviewed and are negative.   {Insert previous labs (optional):23779}  {See past labs  Heme  Chem  Endocrine  Serology  Results Review (optional):1}   Objective    Pulse 69   Temp 98 F (36.7 C) (Oral)   Ht 5\' 3"  (1.6 m)   Wt 126 lb (57.2 kg)   SpO2 98%   BMI 22.32 kg/m  {Insert last BP/Wt (optional):23777}    Physical Exam Vitals reviewed.  Constitutional:      General: He is not in acute distress.    Appearance: Normal appearance. He is well-developed. He is not diaphoretic.  HENT:     Head: Normocephalic and atraumatic.     Right Ear: External ear normal. There is impacted cerumen.     Left Ear: External ear normal. There is impacted cerumen.     Nose: Congestion present.     Right Turbinates: Swollen.     Left Turbinates: Swollen.     Mouth/Throat:     Mouth: Mucous membranes are moist.     Pharynx: Oropharynx is clear. No oropharyngeal exudate.  Eyes:     General: No scleral icterus.    Conjunctiva/sclera: Conjunctivae normal.     Pupils: Pupils are equal, round, and reactive to light.      Comments: Thin (~72mm thickness) arcus senilis bilaterally  Neck:     Thyroid: No thyromegaly.      Comments: Approx. 8mm x 4mm enlarged lymph node Cardiovascular:     Rate and Rhythm: Normal rate and regular rhythm.     Pulses: Normal pulses.     Heart sounds: Normal heart sounds. No murmur heard. Pulmonary:     Effort: Pulmonary effort is normal. No respiratory distress.     Breath sounds: Normal breath sounds. No wheezing or rales.  Abdominal:     General: There is no distension.     Palpations: Abdomen is soft.     Tenderness: There is no abdominal  tenderness.  Musculoskeletal:        General: No deformity.     Cervical back: Neck supple.     Right lower leg: No edema.     Left lower leg: No edema.  Lymphadenopathy:     Cervical: Cervical adenopathy present.     Left cervical: Superficial cervical adenopathy present.  Skin:    General: Skin is warm and dry.     Findings: No rash.  Comments: Small darkly pigmented hyperkeratotic lesion  Neurological:     Mental Status: He is alert and oriented to person, place, and time. Mental status is at baseline.     Gait: Gait normal.  Psychiatric:        Mood and Affect: Mood normal.        Behavior: Behavior normal.        Thought Content: Thought content normal.     Depression Screen    12/07/2022    3:26 PM  PHQ 2/9 Scores  PHQ - 2 Score 0   No results found for any visits on 12/07/22.  Assessment & Plan     Encounter for medical examination to establish care Assessment & Plan: Exam with mild abnormalities as noted above.  Will check routine labs today.  Orders: -     Comprehensive metabolic panel -     Lipid panel -     TSH Rfx on Abnormal to Free T4  Excessive cerumen in both ear canals Assessment & Plan: Patient has significant cerumen in both ears.  Prescribed Debrox as noted below and cautioned patient not to use it beyond 4 days.  Orders: -     Debrox; Place 5 drops into both ears 2 (two) times daily. For a maximum of 4 days.  Dispense: 15 mL; Refill: 0  Hypertrophic scar of skin Assessment & Plan: Discussed with patient that there are really a lot of options to address this that we can try a steroid cream, which I have prescribed below  Orders: -     Triamcinolone Acetonide; Apply 1 Application topically 2 (two) times daily.  Dispense: 30 g; Refill: 0  Seasonal allergies Assessment & Plan: Encouraged patient to resume taking his seasonal allergy pill, as I suspect this is part of why his left lymph node is swollen at this point.  Advised him to  keep an eye on it and let me know if it changes.  At this point, it is fairly small and likely attributable to allergy symptoms, so we will plan to monitor it for now.  If it persists for over a month and does not start to decrease in size, may consider ultrasound at that time.  Orders: -     Cetirizine HCl; Take 1 tablet (10 mg total) by mouth daily.  Dispense: 30 tablet; Refill: 11  Arcus senilis of both corneas Assessment & Plan: Checking lipid panel as noted above      Return in about 1 year (around 12/07/2023).     I discussed the assessment and treatment plan with the patient  The patient was provided an opportunity to ask questions and all were answered. The patient agreed with the plan and demonstrated an understanding of the instructions.   The patient was advised to call back or seek an in-person evaluation if the symptoms worsen or if the condition fails to improve as anticipated.    Sherlyn Hay, DO  [facility] [phone] (phone) [phone] (fax)  [facility]

## 2022-12-09 DIAGNOSIS — H18413 Arcus senilis, bilateral: Secondary | ICD-10-CM | POA: Insufficient documentation

## 2022-12-09 DIAGNOSIS — J302 Other seasonal allergic rhinitis: Secondary | ICD-10-CM | POA: Insufficient documentation

## 2022-12-09 DIAGNOSIS — H5789 Other specified disorders of eye and adnexa: Secondary | ICD-10-CM | POA: Insufficient documentation

## 2022-12-09 DIAGNOSIS — L910 Hypertrophic scar: Secondary | ICD-10-CM | POA: Insufficient documentation

## 2022-12-09 DIAGNOSIS — Z0000 Encounter for general adult medical examination without abnormal findings: Secondary | ICD-10-CM | POA: Insufficient documentation

## 2022-12-09 DIAGNOSIS — H6123 Impacted cerumen, bilateral: Secondary | ICD-10-CM | POA: Insufficient documentation

## 2022-12-09 NOTE — Assessment & Plan Note (Signed)
Exam with mild abnormalities as noted above.  Will check routine labs today.

## 2022-12-09 NOTE — Assessment & Plan Note (Signed)
Encouraged patient to resume taking his seasonal allergy pill, as I suspect this is part of why his left lymph node is swollen at this point.  Advised him to keep an eye on it and let me know if it changes.  At this point, it is fairly small and likely attributable to allergy symptoms, so we will plan to monitor it for now.  If it persists for over a month and does not start to decrease in size, may consider ultrasound at that time.

## 2022-12-09 NOTE — Assessment & Plan Note (Signed)
Patient has significant cerumen in both ears.  Prescribed Debrox as noted below and cautioned patient not to use it beyond 4 days.

## 2022-12-09 NOTE — Assessment & Plan Note (Signed)
Checking lipid panel as noted above

## 2022-12-09 NOTE — Assessment & Plan Note (Signed)
Discussed with patient that there are really a lot of options to address this that we can try a steroid cream, which I have prescribed below

## 2022-12-14 ENCOUNTER — Encounter: Payer: Self-pay | Admitting: Family Medicine

## 2022-12-14 DIAGNOSIS — R599 Enlarged lymph nodes, unspecified: Secondary | ICD-10-CM | POA: Insufficient documentation

## 2022-12-14 NOTE — Assessment & Plan Note (Signed)
As addressed above.

## 2024-01-04 NOTE — Congregational Nurse Program (Signed)
  Dept: [phone]   Congregational Nurse Program Note  Date of Encounter: 01/04/2024 Client to [facility] day center, nurse led clinic with questions about his Medicaid. He was uncertain whether it was still active. RN was able to verify active [employer] with [facility] financial navigator. Client left the center prior to RN being able to advise him of this. RN to advise at client's next center visit. MARLA Marina BSN, RN Past Medical History: Past Medical History:  Diagnosis Date   Testicular torsion 10/15/2019   left    Encounter Details:  Community Questionnaire - 01/04/24 1315       Questionnaire   Ask client: Do you give verbal consent for me to treat you today? Yes    Student Assistance N/A    Location Patient Served  [facility]    Encounter Setting CN site    Population Status Unknown   client lives in a tent   Insurance Medicaid   [employer] effective 04/15/2022, and verified for 2024 and for 2025   Insurance/Financial Assistance Referral N/A    Medication N/A    Medical Provider Yes   new patient apt made today   Screening Referrals Made N/A    Medical Referrals Made N/A    Medical Appointment Completed [facility] P[facility]/Clinic    CNP Interventions Advocate/Support;Case Management    Screenings CN Performed N/A    ED Visit Averted N/A    Life-Saving Intervention Made N/A

## 2024-01-12 ENCOUNTER — Ambulatory Visit: Admitting: Family Medicine

## 2024-05-20 ENCOUNTER — Encounter: Payer: Self-pay | Admitting: Emergency Medicine

## 2024-05-20 ENCOUNTER — Inpatient Hospital Stay
Admission: EM | Admit: 2024-05-20 | Discharge: 2024-05-23 | DRG: 557 | Disposition: A | Discharge status: Home / Self Care | Attending: Osteopathic Medicine | Admitting: Osteopathic Medicine

## 2024-05-20 ENCOUNTER — Emergency Department

## 2024-05-20 DIAGNOSIS — M6282 Rhabdomyolysis: Secondary | ICD-10-CM | POA: Diagnosis not present

## 2024-05-20 DIAGNOSIS — G928 Other toxic encephalopathy: Secondary | ICD-10-CM | POA: Diagnosis present

## 2024-05-20 DIAGNOSIS — G9341 Metabolic encephalopathy: Secondary | ICD-10-CM | POA: Diagnosis present

## 2024-05-20 DIAGNOSIS — E8721 Acute metabolic acidosis: Secondary | ICD-10-CM | POA: Diagnosis present

## 2024-05-20 DIAGNOSIS — D72819 Decreased white blood cell count, unspecified: Secondary | ICD-10-CM | POA: Diagnosis present

## 2024-05-20 DIAGNOSIS — R4182 Altered mental status, unspecified: Principal | ICD-10-CM | POA: Diagnosis present

## 2024-05-20 DIAGNOSIS — F1910 Other psychoactive substance abuse, uncomplicated: Secondary | ICD-10-CM | POA: Diagnosis present

## 2024-05-20 DIAGNOSIS — E872 Acidosis, unspecified: Secondary | ICD-10-CM

## 2024-05-20 DIAGNOSIS — N179 Acute kidney failure, unspecified: Secondary | ICD-10-CM | POA: Diagnosis present

## 2024-05-20 LAB — ETHANOL: Alcohol, Ethyl (B): <15 mg/dL

## 2024-05-20 LAB — URINALYSIS, ROUTINE W REFLEX MICROSCOPIC
Bacteria, UA: NONE SEEN
Bilirubin Urine: NEGATIVE
Glucose, UA: 50 mg/dL — AB
Ketones, ur: 5 mg/dL — AB
Leukocytes,Ua: NEGATIVE
Nitrite: NEGATIVE
Protein, ur: 100 mg/dL — AB
Specific Gravity, Urine: 1.029 (ref 1.005–1.030)
Squamous Epithelial / HPF: 0 /HPF (ref 0–5)
pH: 5 (ref 5.0–8.0)

## 2024-05-20 LAB — COMPREHENSIVE METABOLIC PANEL WITH GFR
ALT: 37 U/L (ref 0–44)
AST: 118 U/L — ABNORMAL HIGH (ref 15–41)
Albumin: 4.2 g/dL (ref 3.5–5.0)
Alkaline Phosphatase: 65 U/L (ref 38–126)
Anion gap: 19 — ABNORMAL HIGH (ref 5–15)
BUN: 25 mg/dL — ABNORMAL HIGH (ref 6–20)
CO2: 16 mmol/L — ABNORMAL LOW (ref 22–32)
Calcium: 9 mg/dL (ref 8.9–10.3)
Chloride: 105 mmol/L (ref 98–111)
Creatinine, Ser: 1.26 mg/dL — ABNORMAL HIGH (ref 0.61–1.24)
GFR, Estimated: 41 mL/min — ABNORMAL LOW
Glucose, Bld: 143 mg/dL — ABNORMAL HIGH (ref 70–99)
Potassium: 3.8 mmol/L (ref 3.5–5.1)
Sodium: 139 mmol/L (ref 135–145)
Total Bilirubin: 0.7 mg/dL (ref 0.0–1.2)
Total Protein: 6.5 g/dL (ref 6.5–8.1)

## 2024-05-20 LAB — CBC
HCT: 37.9 % — ABNORMAL LOW (ref 39.0–52.0)
Hemoglobin: 12.8 g/dL — ABNORMAL LOW (ref 13.0–17.0)
MCH: 33.1 pg (ref 26.0–34.0)
MCHC: 33.8 g/dL (ref 30.0–36.0)
MCV: 97.9 fL (ref 80.0–100.0)
Platelets: 234 K/uL (ref 150–400)
RBC: 3.87 MIL/uL — ABNORMAL LOW (ref 4.22–5.81)
RDW: 11.2 % — ABNORMAL LOW (ref 11.5–15.5)
WBC: 4.7 K/uL (ref 4.0–10.5)
nRBC: 0 % (ref 0.0–0.2)

## 2024-05-20 LAB — URINE DRUG SCREEN
Amphetamines: NEGATIVE
Barbiturates: NEGATIVE
Benzodiazepines: POSITIVE — AB
Cocaine: POSITIVE — AB
Fentanyl: NEGATIVE
Methadone Scn, Ur: NEGATIVE
Opiates: NEGATIVE
Tetrahydrocannabinol: POSITIVE — AB

## 2024-05-20 LAB — SAMPLE TO BLOOD BANK

## 2024-05-20 LAB — SALICYLATE LEVEL: Salicylate Lvl: <7 mg/dL — ABNORMAL LOW (ref 7.0–30.0)

## 2024-05-20 LAB — CBG MONITORING, ED: Glucose-Capillary: 135 mg/dL — ABNORMAL HIGH (ref 70–99)

## 2024-05-20 LAB — HIV ANTIBODY (ROUTINE TESTING W REFLEX): HIV Screen 4th Generation wRfx: NONREACTIVE

## 2024-05-20 LAB — PROTIME-INR
INR: 1.1 (ref 0.8–1.2)
Prothrombin Time: 14.9 s (ref 11.4–15.2)

## 2024-05-20 LAB — LACTIC ACID, PLASMA
Lactic Acid, Venous: 4.9 mmol/L (ref 0.5–1.9)
Lactic Acid, Venous: 1 mmol/L (ref 0.5–1.9)

## 2024-05-20 LAB — ACETAMINOPHEN LEVEL: Acetaminophen (Tylenol), Serum: <10 ug/mL — ABNORMAL LOW (ref 10–30)

## 2024-05-20 LAB — CK
Total CK: 2998 U/L — ABNORMAL HIGH (ref 49–397)
Total CK: 3712 U/L — ABNORMAL HIGH (ref 49–397)

## 2024-05-20 MED ORDER — ACETAMINOPHEN 325 MG PO TABS: 650.0000 mg | ORAL_TABLET | Freq: Four times a day (QID) | ORAL | Status: DC | PRN

## 2024-05-20 MED ORDER — ACETAMINOPHEN 650 MG RE SUPP: 650.0000 mg | Freq: Four times a day (QID) | RECTAL | Status: DC | PRN

## 2024-05-20 MED ORDER — HEPARIN SODIUM (PORCINE) 5000 UNIT/ML IJ SOLN
5000.0000 [IU] | Freq: Three times a day (TID) | INTRAMUSCULAR | Status: DC
  Administered 2024-05-20 – 2024-05-21 (×3): 5000 [IU] via SUBCUTANEOUS
  Filled 2024-05-20 (×3): qty 1

## 2024-05-20 MED ORDER — POLYETHYLENE GLYCOL 3350 17 G PO PACK: 17.0000 g | PACK | Freq: Every day | ORAL | Status: DC | PRN

## 2024-05-20 MED ORDER — MORPHINE SULFATE (PF) 2 MG/ML IV SOLN: 2.0000 mg | INTRAVENOUS | Status: DC | PRN

## 2024-05-20 MED ORDER — KETOROLAC TROMETHAMINE 15 MG/ML IJ SOLN: 15.0000 mg | Freq: Four times a day (QID) | INTRAMUSCULAR | Status: DC | PRN

## 2024-05-20 MED ORDER — ONDANSETRON HCL 4 MG PO TABS: 4.0000 mg | ORAL_TABLET | Freq: Four times a day (QID) | ORAL | Status: DC | PRN

## 2024-05-20 MED ORDER — ONDANSETRON HCL 4 MG/2ML IJ SOLN: 4.0000 mg | Freq: Four times a day (QID) | INTRAMUSCULAR | Status: DC | PRN

## 2024-05-20 MED ORDER — OXYCODONE HCL 5 MG PO TABS: 5.0000 mg | ORAL_TABLET | ORAL | Status: DC | PRN

## 2024-05-20 MED ADMIN — Sodium Chloride IV Soln 0.9%: 1000 mL | INTRAVENOUS | NDC 00338004904

## 2024-05-20 MED ADMIN — Iohexol Inj 300 MG/ML: 75 mL | INTRAVENOUS | NDC 00407141363

## 2024-05-20 MED ADMIN — Sodium Chloride IV Soln 0.9%: 150 mL/h | INTRAVENOUS | NDC 00264580200

## 2024-05-20 NOTE — ED Notes (Signed)
 Pt refused dinner tray

## 2024-05-20 NOTE — ED Notes (Signed)
 Pt witnessed walking out of room talking out loud to self. Primary RN made aware and pt met at side B nurses station. Pt attempting to pull IV out.
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
## 2024-05-20 NOTE — ED Notes (Signed)
 CCMD monitoring pt. Verified by this RN

## 2024-05-20 NOTE — ED Notes (Signed)
IVC  MEDICAL  ADMIT 

## 2024-05-20 NOTE — ED Notes (Signed)
Attempted to call emergency contact with no answer 

## 2024-05-20 NOTE — ED Notes (Signed)
 Pts belongings set on belongings cart. 3 bags total.
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
## 2024-05-20 NOTE — ED Notes (Signed)
 Pt changed into burgundy scrubs a this time.
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
## 2024-05-21 ENCOUNTER — Other Ambulatory Visit: Payer: Self-pay

## 2024-05-21 ENCOUNTER — Encounter: Payer: Self-pay | Admitting: Internal Medicine

## 2024-05-21 DIAGNOSIS — G9341 Metabolic encephalopathy: Secondary | ICD-10-CM

## 2024-05-21 LAB — COMPREHENSIVE METABOLIC PANEL WITH GFR
ALT: 34 U/L (ref 0–44)
AST: 92 U/L — ABNORMAL HIGH (ref 15–41)
Albumin: 3.5 g/dL (ref 3.5–5.0)
Alkaline Phosphatase: 54 U/L (ref 38–126)
Anion gap: 11 (ref 5–15)
BUN: 11 mg/dL (ref 6–20)
CO2: 21 mmol/L — ABNORMAL LOW (ref 22–32)
Calcium: 8.1 mg/dL — ABNORMAL LOW (ref 8.9–10.3)
Chloride: 108 mmol/L (ref 98–111)
Creatinine, Ser: 0.85 mg/dL (ref 0.61–1.24)
GFR, Estimated: >60 mL/min
Glucose, Bld: 122 mg/dL — ABNORMAL HIGH (ref 70–99)
Potassium: 3.7 mmol/L (ref 3.5–5.1)
Sodium: 139 mmol/L (ref 135–145)
Total Bilirubin: 0.6 mg/dL (ref 0.0–1.2)
Total Protein: 5.3 g/dL — ABNORMAL LOW (ref 6.5–8.1)

## 2024-05-21 LAB — CBC
HCT: 36.2 % — ABNORMAL LOW (ref 39.0–52.0)
Hemoglobin: 11.9 g/dL — ABNORMAL LOW (ref 13.0–17.0)
MCH: 32.2 pg (ref 26.0–34.0)
MCHC: 32.9 g/dL (ref 30.0–36.0)
MCV: 98.1 fL (ref 80.0–100.0)
Platelets: 224 K/uL (ref 150–400)
RBC: 3.69 MIL/uL — ABNORMAL LOW (ref 4.22–5.81)
RDW: 11.2 % — ABNORMAL LOW (ref 11.5–15.5)
WBC: 3.3 K/uL — ABNORMAL LOW (ref 4.0–10.5)
nRBC: 0 % (ref 0.0–0.2)

## 2024-05-21 LAB — PROTIME-INR
INR: 1.1 (ref 0.8–1.2)
Prothrombin Time: 14.8 s (ref 11.4–15.2)

## 2024-05-21 LAB — CK
Total CK: 2168 U/L — ABNORMAL HIGH (ref 49–397)
Total CK: 1973 U/L — ABNORMAL HIGH (ref 49–397)

## 2024-05-21 NOTE — Progress Notes (Signed)
" °   05/21/24 1435  Vitals  Temp (!) 97.5 F (36.4 C)  BP 129/77  MAP (mmHg) 92  BP Location Right Arm  BP Method Automatic  Patient Position (if appropriate) Lying  Pulse Rate 61  Pulse Rate Source Monitor  Resp 16  MEWS COLOR  MEWS Score Color Green  Oxygen Therapy  SpO2 100 %  O2 Device Room Air  MEWS Score  MEWS Temp 0  MEWS Systolic 0  MEWS Pulse 0  MEWS RR 0  MEWS LOC 0  MEWS Score 0   Patient admitted from ED alert, room air, sitter, and stable vitals.  Skin assessment completed with no further needs from patient at this time.  Plan of care continues.  "

## 2024-05-21 NOTE — ED Notes (Signed)
 Pt ambulated to BR with steady gait.

## 2024-05-21 NOTE — Plan of Care (Signed)
" °  Problem: Education: Goal: Knowledge of General Education information will improve Description: Including pain rating scale, medication(s)/side effects and non-pharmacologic comfort measures 05/21/2024 1944 by Arlyss Tolbert BIRCH, RN Outcome: Progressing 05/21/2024 1944 by Arlyss Tolbert BIRCH, RN Outcome: Progressing   Problem: Health Behavior/Discharge Planning: Goal: Ability to manage health-related needs will improve 05/21/2024 1944 by Arlyss Tolbert BIRCH, RN Outcome: Progressing 05/21/2024 1944 by Arlyss Tolbert BIRCH, RN Outcome: Progressing   Problem: Clinical Measurements: Goal: Ability to maintain clinical measurements within normal limits will improve 05/21/2024 1944 by Arlyss Tolbert BIRCH, RN Outcome: Progressing 05/21/2024 1944 by Arlyss Tolbert BIRCH, RN Outcome: Progressing Goal: Will remain free from infection 05/21/2024 1944 by Arlyss Tolbert BIRCH, RN Outcome: Progressing 05/21/2024 1944 by Arlyss Tolbert BIRCH, RN Outcome: Progressing Goal: Diagnostic test results will improve 05/21/2024 1944 by Arlyss Tolbert BIRCH, RN Outcome: Progressing 05/21/2024 1944 by Arlyss Tolbert D, RN Outcome: Progressing Goal: Respiratory complications will improve 05/21/2024 1944 by Arlyss Tolbert BIRCH, RN Outcome: Progressing 05/21/2024 1944 by Arlyss Tolbert D, RN Outcome: Progressing Goal: Cardiovascular complication will be avoided 05/21/2024 1944 by Arlyss Tolbert BIRCH, RN Outcome: Progressing 05/21/2024 1944 by Arlyss Tolbert BIRCH, RN Outcome: Progressing   Problem: Activity: Goal: Risk for activity intolerance will decrease 05/21/2024 1944 by Arlyss Tolbert BIRCH, RN Outcome: Progressing 05/21/2024 1944 by Arlyss Tolbert BIRCH, RN Outcome: Progressing   Problem: Nutrition: Goal: Adequate nutrition will be maintained 05/21/2024 1944 by Arlyss Tolbert BIRCH, RN Outcome: Progressing 05/21/2024 1944 by Arlyss Tolbert D, RN Outcome: Progressing   Problem: Coping: Goal: Level of anxiety will decrease 05/21/2024 1944 by Arlyss Tolbert BIRCH, RN Outcome: Progressing 05/21/2024 1944 by Arlyss Tolbert BIRCH, RN Outcome: Progressing   Problem: Elimination: Goal: Will not experience complications related to bowel motility 05/21/2024 1944 by Arlyss Tolbert BIRCH, RN Outcome: Progressing 05/21/2024 1944 by Arlyss Tolbert BIRCH, RN Outcome: Progressing Goal: Will not experience complications related to urinary retention 05/21/2024 1944 by Arlyss Tolbert BIRCH, RN Outcome: Progressing 05/21/2024 1944 by Arlyss Tolbert BIRCH, RN Outcome: Progressing   Problem: Pain Managment: Goal: General experience of comfort will improve and/or be controlled 05/21/2024 1944 by Arlyss Tolbert BIRCH, RN Outcome: Progressing 05/21/2024 1944 by Arlyss Tolbert BIRCH, RN Outcome: Progressing   Problem: Safety: Goal: Ability to remain free from injury will improve 05/21/2024 1944 by Arlyss Tolbert BIRCH, RN Outcome: Progressing 05/21/2024 1944 by Arlyss Tolbert D, RN Outcome: Progressing   Problem: Skin Integrity: Goal: Risk for impaired skin integrity will decrease 05/21/2024 1944 by Arlyss Tolbert BIRCH, RN Outcome: Progressing 05/21/2024 1944 by Arlyss Tolbert BIRCH, RN Outcome: Progressing   "

## 2024-05-21 NOTE — ED Notes (Signed)
 IVC/ Medical admit pending
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
## 2024-05-21 NOTE — ED Notes (Signed)
Pt up to the shower at this time 

## 2024-05-22 DIAGNOSIS — M6282 Rhabdomyolysis: Secondary | ICD-10-CM | POA: Diagnosis present

## 2024-05-22 DIAGNOSIS — F1910 Other psychoactive substance abuse, uncomplicated: Secondary | ICD-10-CM | POA: Diagnosis present

## 2024-05-22 DIAGNOSIS — N179 Acute kidney failure, unspecified: Secondary | ICD-10-CM | POA: Diagnosis present

## 2024-05-22 DIAGNOSIS — G928 Other toxic encephalopathy: Secondary | ICD-10-CM | POA: Diagnosis present

## 2024-05-22 DIAGNOSIS — T796XXA Traumatic ischemia of muscle, initial encounter: Secondary | ICD-10-CM

## 2024-05-22 DIAGNOSIS — D72819 Decreased white blood cell count, unspecified: Secondary | ICD-10-CM | POA: Diagnosis present

## 2024-05-22 DIAGNOSIS — E8721 Acute metabolic acidosis: Secondary | ICD-10-CM | POA: Diagnosis present

## 2024-05-22 LAB — CBC WITH DIFFERENTIAL/PLATELET
Abs Immature Granulocytes: 0.06 K/uL (ref 0.00–0.07)
Basophils Absolute: 0.1 K/uL (ref 0.0–0.1)
Basophils Relative: 1 %
Eosinophils Absolute: 0.2 K/uL (ref 0.0–0.5)
Eosinophils Relative: 3 %
HCT: 35.2 % — ABNORMAL LOW (ref 39.0–52.0)
Hemoglobin: 11.7 g/dL — ABNORMAL LOW (ref 13.0–17.0)
Immature Granulocytes: 1 %
Lymphocytes Relative: 27 %
Lymphs Abs: 1.5 K/uL (ref 0.7–4.0)
MCH: 32.6 pg (ref 26.0–34.0)
MCHC: 33.2 g/dL (ref 30.0–36.0)
MCV: 98.1 fL (ref 80.0–100.0)
Monocytes Absolute: 0.6 K/uL (ref 0.1–1.0)
Monocytes Relative: 11 %
Neutro Abs: 3.1 K/uL (ref 1.7–7.7)
Neutrophils Relative %: 57 %
Platelets: 212 K/uL (ref 150–400)
RBC: 3.59 MIL/uL — ABNORMAL LOW (ref 4.22–5.81)
RDW: 10.8 % — ABNORMAL LOW (ref 11.5–15.5)
WBC: 5.4 K/uL (ref 4.0–10.5)
nRBC: 0 % (ref 0.0–0.2)

## 2024-05-22 LAB — CK: Total CK: 1219 U/L — ABNORMAL HIGH (ref 49–397)

## 2024-05-22 LAB — BASIC METABOLIC PANEL WITH GFR
Anion gap: 7 (ref 5–15)
BUN: 7 mg/dL (ref 6–20)
CO2: 25 mmol/L (ref 22–32)
Calcium: 8.3 mg/dL — ABNORMAL LOW (ref 8.9–10.3)
Chloride: 108 mmol/L (ref 98–111)
Creatinine, Ser: 0.87 mg/dL (ref 0.61–1.24)
GFR, Estimated: >60 mL/min
Glucose, Bld: 96 mg/dL (ref 70–99)
Potassium: 4.2 mmol/L (ref 3.5–5.1)
Sodium: 140 mmol/L (ref 135–145)
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
## 2024-05-22 NOTE — TOC CM/SW Note (Addendum)
 Transition of Care [facility]) - Inpatient Brief Assessment   Patient Details  Name: Sean Shaw MRN: [id number] Date of Birth: 1991/04/01  Transition of Care [facility]) CM/SW Contact:    Daved JONETTA Hamilton, RN Phone Number: 05/22/2024, 10:02 AM   Clinical Narrative:   Transition of Care [facility]) Screening Note   Patient Details  Name: Sean Shaw Date of Birth: 12/14/1990   Transition of Care [facility]) CM/SW Contact:    Daved JONETTA Hamilton, RN Phone Number: 05/22/2024, 10:28 AM    Transition of Care Department [facility]) has reviewed patient and no TOC needs have been identified at this time. If new patient transition needs arise, please place a TOC consult.    Transition of Care Asessment: Insurance and Status: Insurance coverage has been reviewed Patient has primary care physician: Yes   Prior level of function:: Independent Prior/Current Home Services: No current home services Social Drivers of Health Review: SDOH reviewed no interventions necessary Readmission risk has been reviewed: No (Patient is in Observation status, no score generated) Transition of care needs: no transition of care needs at this time

## 2024-05-22 NOTE — Plan of Care (Signed)
   Problem: Clinical Measurements: Goal: Ability to maintain clinical measurements within normal limits will improve Outcome: Progressing
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
## 2024-05-23 ENCOUNTER — Encounter: Payer: Self-pay | Admitting: Family Medicine

## 2024-05-23 LAB — CBC
HCT: 38 % — ABNORMAL LOW (ref 39.0–52.0)
Hemoglobin: 12.8 g/dL — ABNORMAL LOW (ref 13.0–17.0)
MCH: 32.7 pg (ref 26.0–34.0)
MCHC: 33.7 g/dL (ref 30.0–36.0)
MCV: 96.9 fL (ref 80.0–100.0)
Platelets: 236 K/uL (ref 150–400)
RBC: 3.92 MIL/uL — ABNORMAL LOW (ref 4.22–5.81)
RDW: 11 % — ABNORMAL LOW (ref 11.5–15.5)
WBC: 13.1 K/uL — ABNORMAL HIGH (ref 4.0–10.5)
nRBC: 0 % (ref 0.0–0.2)

## 2024-05-23 LAB — BASIC METABOLIC PANEL WITH GFR
Anion gap: 7 (ref 5–15)
BUN: 9 mg/dL (ref 6–20)
CO2: 28 mmol/L (ref 22–32)
Calcium: 9.1 mg/dL (ref 8.9–10.3)
Chloride: 105 mmol/L (ref 98–111)
Creatinine, Ser: 0.96 mg/dL (ref 0.61–1.24)
GFR, Estimated: >60 mL/min
Glucose, Bld: 91 mg/dL (ref 70–99)
Potassium: 4.2 mmol/L (ref 3.5–5.1)
Sodium: 139 mmol/L (ref 135–145)

## 2024-05-23 LAB — CK: Total CK: 646 U/L — ABNORMAL HIGH (ref 49–397)

## 2024-05-23 NOTE — Consult Note (Signed)
 [facility] Psychiatric Consult Initial  Patient Name: .Sean Shaw  MRN: [id number]  DOB: 1991-04-12  Consult Order details:  Orders (From admission, onward)     Start     Ordered   05/22/24 1634  IP CONSULT TO PSYCHIATRY       Ordering Provider: Claudene Zelda NOVAK, NP  Provider:  (Not yet assigned)  Question Answer Comment  Location [facility]   Reason for Consult? IVC assessment      05/22/24 1633             Mode of Visit: In person    Psychiatry Consult Evaluation  Service Date: May 23, 2024 LOS:  LOS: 2 days  Chief Complaint IVC evaluation  Primary Psychiatric Diagnoses  IVC evaluation    Assessment   Patient no longer meets [place]  involuntary commitment criteria at this time. Per [place] Statute  122C-3(11), involuntary commitment requires that an individual have a mental illness and be dangerous to self or others. Patient does not meet these criteria based on the following assessment:  Patient's altered mental status and elopement from the hospital that precipitated the involuntary commitment appear to have been caused by a medical condition (rhabdomyolysis) with associated delirium or encephalopathy, potentially exacerbated by substance use (marijuana) or medication effects, rather than a primary psychiatric illness. Patient's mental status has now cleared with medical treatment and stabilization of his rhabdomyolysis. Patient is currently alert, oriented, coherent, and demonstrates full capacity to understand his medical condition, appreciate the need for continued treatment, and make informed decisions about his care.  Patient is not currently dangerous to himself or others. He denies suicidal and homicidal ideation, demonstrates no psychotic symptoms or dangerous behaviors, shows appropriate insight into the events that occurred and why involuntary commitment was necessary at that time, verbalizes understanding of medical team's  recommendations including substance abstinence, and is cooperative and engaged with the treatment team. Patient has appropriate supports in place, including independent housing and full-time employment, and demonstrates ability to care for himself in the community.  Patient's attempt to leave the hospital occurred in the context of acute confusion related to his medical condition/potential substance use (rhabdomyolysis with altered mental status) rather than reflecting ongoing psychiatric illness or intent to harm himself. Now that patient's mental status has cleared and he understands his medical situation, he is no longer at risk of leaving against medical advice or refusing necessary treatment. Patient's ability to ask questions about preventing future recurrence demonstrates engagement with his health and appropriate future planning.  There is no evidence of primary psychiatric disorder requiring involuntary psychiatric treatment. Patient denies current psychiatric symptoms, has no history of psychiatric illness or treatment, and his altered mental status was likely related to his acute medical condition/substance use rather than mental illness.   Patient is psychiatrically cleared and does not require continued involuntary commitment. Patient may remain in the hospital voluntarily for completion of medical treatment for rhabdomyolysis. Involuntary commitment papers should be rescinded at this time, as patient is medically stable from a mental status perspective, demonstrates capacity for medical decision-making, is cooperative with treatment, and does not meet criteria for ongoing involuntary psychiatric commitment.   The above assessment and recommendations were communicated to the primary medical team. Patient is agreeable to continuing medical treatment voluntarily and demonstrates understanding of the importance of completing his treatment course before discharge.    Diagnoses:  Active Hospital  problems: Principal Problem:   Rhabdomyolysis Active Problems:   Altered mental status  Polysubstance abuse (HCC)   Acute metabolic encephalopathy    Plan   ## Psychiatric Medication Recommendations:  None at this time  ## Medical Decision Making Capacity: Not specifically addressed in this encounter     ## Disposition:-- There are no psychiatric contraindications to discharge at this time  ## Behavioral / Environmental: - No specific recommendations at this time.     ## Safety and Observation Level:  - Based on my clinical evaluation, I estimate the patient to be at low risk of self harm in the current setting. - At this time, we recommend  routine. This decision is based on my review of the chart including patient's history and current presentation, interview of the patient, mental status examination, and consideration of suicide risk including evaluating suicidal ideation, plan, intent, suicidal or self-harm behaviors, risk factors, and protective factors. This judgment is based on our ability to directly address suicide risk, implement suicide prevention strategies, and develop a safety plan while the patient is in the clinical setting. Please contact our team if there is a concern that risk level has changed.  CSSR Risk Category:C-SSRS RISK CATEGORY: No Risk  Suicide Risk Assessment: Patient has following modifiable risk factors for suicide: pain, medical illness (ie new dx of cancer), which we are addressing by utilizing therapeutic communication to discuss patient's current presentation. Patient has following non-modifiable or demographic risk factors for suicide: male gender Patient has the following protective factors against suicide: no history of suicide attempts and no history of NSSIB  Thank you for this consult request. Recommendations have been communicated to the primary team.  We will sign off at this time.   Zelda Sharps, NP        History of Present Illness   Relevant Aspects of [facility]   Patient Report:  Psychiatry was consulted by the medical team due to involuntary commitment papers being initiated in the emergency department. Per medical team report, IVC papers were taken out after patient experienced altered mental status and attempted to leave or actually left the hospital, which required IVC paper initiation and police department involvement to bring patient back to the hospital for necessary medical treatment. Medical team reported patient was admitted with rhabdomyolysis, which has improved clinically per their assessment.  On psychiatric assessment today, patient was alert and oriented to person, place, time, and situation with this provider. Patient was able to accurately relay what brought him to the hospital initially and explained the events surrounding him leaving the hospital. Patient reported having what he believes was a potential adverse reaction to substances he took, including marijuana, or potentially some of the medications that were administered to him in the emergency department, which caused him to become loopy and confused. Patient reported he remembered waking up and noticing that he had two wristbands that were of different colors. Of note, the emergency department does use yellow fall risk bands to indicate patients at high risk for falls, which may have contributed to patient's confusion. Patient stated he did not see his name clearly on the wristband, which caused him some concern, so he got up and asked to leave. He reported someone told him he could leave, so he left the hospital at that time.  Patient acknowledged he was confused during that time and stated he understands now why the medical team initiated involuntary commitment papers and why continued medical treatment was so important for his condition. Patient was able to tell this provider that he has been diagnosed with  rhabdomyolysis and that the team is  administering intravenous fluids to improve this condition and prevent kidney damage. Patient demonstrated appropriate insight and engagement with his medical care, as evidenced by asking this provider how he can prevent further recurrence of rhabdomyolysis in the future, which shows future-oriented thinking and investment in his health.   Patient currently denied suicidal or homicidal ideation. He denied any previous self-harm or suicide attempts in the past. Patient denied auditory or visual hallucinations. On mental status examination, there was no objective evidence of psychosis, mania, or patient responding to internal stimuli. Patient's thought process was logical, coherent, and goal-oriented throughout the interview.  Patient opened up to this provider about his background, sharing that he lived much of his teenage years and childhood in a group home because both of his parents struggled with substance use. Patient reported he is grateful for his time at the group home, as he believes it contributed to him being successful today. He stated that without this intervention, he believes he would have ended up either in jail or getting into significant trouble. Patient reported he currently resides on his own and works full-time at [employer], demonstrating appropriate independent functioning and employment stability.  This provider discussed the medical team's recommendations to abstain from all substances, including marijuana and cocaine, that could contribute to altered mental status, rhabdomyolysis recurrence, or other medical complications. Patient verbalized understanding of these recommendations at this time. Patient appears coherent and is receptive to and understanding of the information the medical team has relayed to him regarding his condition, treatment plan, and substance use cessation recommendations.     Psychiatric and Social History  Psychiatric History:  Information collected from  patient  Prev Dx/Sx: Reported in his teenage years diagnosed with what he believed was oppositional defiant disorder Current Psych Provider: None currently Home Meds (current): None currently Previous Med Trials: Unsure Therapy: None currently  Prior Psych Hospitalization: Denied Prior Self Harm: Denied Prior Violence: Denied  Family Psych History: Substance abuse Family Hx suicide: Denied  Social History:    Occupational Hx: Employed full-time [employer] Hx: Denied Living Situation: Lives independently  Access to weapons/lethal means: Denied  Substance History Alcohol: Occasional History of alcohol withdrawal seizures denied History of DT's denied Tobacco: Denied Illicit drugs: Reported marijuana use Prescription drug abuse: Denied Rehab hx: Denied  Exam Findings  Physical Exam: Reviewed and agree with the physical exam findings conducted by the medical provider Vital Signs:  Temp:  [97.8 F (36.6 C)-98.8 F (37.1 C)] 97.9 F (36.6 C) (01/08 0751) Pulse Rate:  [59-76] 62 (01/08 0751) Resp:  [16-19] 19 (01/08 0751) BP: (111-129)/(65-83) 113/71 (01/08 0751) SpO2:  [99 %-100 %] 100 % (01/08 0751) Blood pressure 113/71, pulse 62, temperature 97.9 F (36.6 C), resp. rate 19, SpO2 100%. There is no height or weight on file to calculate BMI.    Mental Status Exam: General Appearance: Casual  Orientation:  Full (Time, Place, and Person)  Memory:  Good  Concentration:  Concentration: Good  Recall:  Good  Attention  Good  Eye Contact:  Good  Speech:  Clear and Coherent  Language:  Good  Volume:  Normal  Mood: Euthymic  Affect:  Congruent  Thought Process:  Coherent, Goal Directed, and Linear  Thought Content:  Logical  Suicidal Thoughts:  No  Homicidal Thoughts:  No  Judgement:  Fair  Insight:  Fair  Psychomotor Activity:  Normal  Akathisia:  No  Fund of Knowledge:  Fair  Assets:  Communication Skills Desire for Improvement Housing Physical  Health Talents/Skills Transportation  Cognition:  WNL  ADL's:  Intact  AIMS (if indicated):        Other History   These have been pulled in through the EMR, reviewed, and updated if appropriate.  Family History:  The patient's family history is not on file.  Medical History: History reviewed. No pertinent past medical history.  Surgical History: History reviewed. No pertinent surgical history.   Medications:  Current Medications[1]  Allergies: Allergies[2]  Zelda Sharps, NP This note was created using Dragon dictation software. Please excuse any inadvertent transcription errors. Case was discussed with supervising physician Dr. Jadapalle who is agreeable with current plan.        [1]  Current Facility-Administered Medications:    acetaminophen  (TYLENOL ) tablet 650 mg, 650 mg, Oral, Q6H PRN **OR** acetaminophen  (TYLENOL ) suppository 650 mg, 650 mg, Rectal, Q6H PRN, Paudel, Keshab, MD   heparin  injection 5,000 Units, 5,000 Units, Subcutaneous, Q8H, Paudel, Keshab, MD, 5,000 Units at 05/21/24 9365   ketorolac  (TORADOL ) 15 MG/ML injection 15 mg, 15 mg, Intravenous, Q6H PRN, Paudel, Keshab, MD   morphine  (PF) 2 MG/ML injection 2 mg, 2 mg, Intravenous, Q4H PRN, Paudel, Keshab, MD   ondansetron  (ZOFRAN ) tablet 4 mg, 4 mg, Oral, Q6H PRN **OR** ondansetron  (ZOFRAN ) injection 4 mg, 4 mg, Intravenous, Q6H PRN, Paudel, Keshab, MD   oxyCODONE  (Oxy IR/ROXICODONE ) immediate release tablet 5 mg, 5 mg, Oral, Q4H PRN, Paudel, Keshab, MD   polyethylene glycol (MIRALAX  / GLYCOLAX ) packet 17 g, 17 g, Oral, Daily PRN, Paudel, Keshab, MD [2] Not on File
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
## 2024-05-23 NOTE — BH Assessment (Signed)
 IVC PAPERS  RESCINDED PER  ZELDA SHARPS  NP  INFORMED  REBECCA  ODONNELL  RN

## 2024-05-23 NOTE — Plan of Care (Signed)

## 2024-05-24 ENCOUNTER — Telehealth: Payer: Self-pay

## 2024-05-24 NOTE — Transitions of Care (Post Inpatient/ED Visit) (Unsigned)
" ° °  05/24/2024  Name: Sean Shaw MRN: [id number] DOB: [birth year]  Today's TOC FU Call Status: Today's TOC FU Call Status:: Unsuccessful Call (1st Attempt) Unsuccessful Call (1st Attempt) Date: 05/24/24  Attempted to reach the patient regarding the most recent Inpatient/ED visit.  Follow Up Plan: Additional outreach attempts will be made to reach the patient to complete the Transitions of Care (Post Inpatient/ED visit) call.   Signature Julian Lemmings, LPN [facility] Nurse Health Advisor Direct Dial [phone]  "

## 2024-05-27 NOTE — Transitions of Care (Post Inpatient/ED Visit) (Unsigned)
" ° °  05/27/2024  Name: Sean Shaw MRN: [id number] DOB: 29-Dec-1990  Today's TOC FU Call Status: Today's TOC FU Call Status:: Unsuccessful Call (2nd Attempt) Unsuccessful Call (1st Attempt) Date: 05/24/24 Unsuccessful Call (2nd Attempt) Date: 05/27/24  Attempted to reach the patient regarding the most recent Inpatient/ED visit.  Follow Up Plan: Additional outreach attempts will be made to reach the patient to complete the Transitions of Care (Post Inpatient/ED visit) call.   Signature Julian Lemmings, LPN [facility] Nurse Health Advisor Direct Dial [phone]  "

## 2024-05-28 NOTE — Transitions of Care (Post Inpatient/ED Visit) (Signed)
" ° °  05/28/2024  Name: Sean Shaw MRN: [id number] DOB: 1990-09-22  Today's TOC FU Call Status: Today's TOC FU Call Status:: Unsuccessful Call (3rd Attempt) Unsuccessful Call (1st Attempt) Date: 05/24/24 Unsuccessful Call (2nd Attempt) Date: 05/27/24 Unsuccessful Call (3rd Attempt) Date: 05/28/24  Attempted to reach the patient regarding the most recent Inpatient/ED visit.  Follow Up Plan: No further outreach attempts will be made at this time. We have been unable to contact the patient.  Signature Julian Lemmings, LPN [facility] Nurse Health Advisor Direct Dial [phone]  "
